# Patient Record
Sex: Female | Born: 1980 | Race: White | Hispanic: No | Smoking: Never smoker
Health system: Southern US, Community
[De-identification: ages and names within clinical notes are randomized; demographics above are authoritative.]

## PROBLEM LIST (undated history)

## (undated) DIAGNOSIS — E785 Hyperlipidemia, unspecified: Secondary | ICD-10-CM

## (undated) DIAGNOSIS — F209 Schizophrenia, unspecified: Secondary | ICD-10-CM

## (undated) DIAGNOSIS — E079 Disorder of thyroid, unspecified: Secondary | ICD-10-CM

## (undated) DIAGNOSIS — F32A Depression, unspecified: Secondary | ICD-10-CM

## (undated) DIAGNOSIS — F419 Anxiety disorder, unspecified: Secondary | ICD-10-CM

## (undated) DIAGNOSIS — D219 Benign neoplasm of connective and other soft tissue, unspecified: Secondary | ICD-10-CM

## (undated) DIAGNOSIS — K219 Gastro-esophageal reflux disease without esophagitis: Secondary | ICD-10-CM

## (undated) DIAGNOSIS — E119 Type 2 diabetes mellitus without complications: Secondary | ICD-10-CM

## (undated) DIAGNOSIS — I10 Essential (primary) hypertension: Secondary | ICD-10-CM

## (undated) HISTORY — PX: ABDOMINAL SURGERY: SHX537

## (undated) HISTORY — DX: Schizophrenia, unspecified: F20.9

## (undated) HISTORY — PX: LAPAROSCOPIC GASTRIC BANDING: SHX1100

## (undated) HISTORY — DX: Anxiety disorder, unspecified: F41.9

## (undated) HISTORY — DX: Hyperlipidemia, unspecified: E78.5

## (undated) HISTORY — DX: Disorder of thyroid, unspecified: E07.9

## (undated) HISTORY — DX: Benign neoplasm of connective and other soft tissue, unspecified: D21.9

## (undated) HISTORY — DX: Gastro-esophageal reflux disease without esophagitis: K21.9

## (undated) HISTORY — PX: LAPAROSCOPIC GASTRIC SLEEVE RESECTION: SHX5895

## (undated) HISTORY — DX: Depression, unspecified: F32.A

---

## 2018-07-14 DIAGNOSIS — K760 Fatty (change of) liver, not elsewhere classified: Secondary | ICD-10-CM | POA: Insufficient documentation

## 2018-07-14 HISTORY — DX: Fatty (change of) liver, not elsewhere classified: K76.0

## 2021-09-11 DIAGNOSIS — I10 Essential (primary) hypertension: Secondary | ICD-10-CM | POA: Insufficient documentation

## 2021-09-11 DIAGNOSIS — F319 Bipolar disorder, unspecified: Secondary | ICD-10-CM | POA: Insufficient documentation

## 2021-09-11 DIAGNOSIS — N941 Unspecified dyspareunia: Secondary | ICD-10-CM | POA: Insufficient documentation

## 2021-12-23 ENCOUNTER — Other Ambulatory Visit: Payer: Self-pay

## 2021-12-23 ENCOUNTER — Emergency Department: Payer: Medicare HMO

## 2021-12-23 ENCOUNTER — Emergency Department
Admission: EM | Admit: 2021-12-23 | Discharge: 2021-12-23 | Disposition: A | Discharge status: Home / Self Care | Payer: Medicare HMO | Attending: Emergency Medicine | Admitting: Emergency Medicine

## 2021-12-23 DIAGNOSIS — R0989 Other specified symptoms and signs involving the circulatory and respiratory systems: Secondary | ICD-10-CM | POA: Diagnosis not present

## 2021-12-23 DIAGNOSIS — R42 Dizziness and giddiness: Secondary | ICD-10-CM | POA: Diagnosis not present

## 2021-12-23 DIAGNOSIS — R1110 Vomiting, unspecified: Secondary | ICD-10-CM | POA: Insufficient documentation

## 2021-12-23 DIAGNOSIS — R059 Cough, unspecified: Secondary | ICD-10-CM | POA: Diagnosis present

## 2021-12-23 HISTORY — DX: Essential (primary) hypertension: I10

## 2021-12-23 HISTORY — DX: Type 2 diabetes mellitus without complications: E11.9

## 2021-12-23 MED ORDER — LIDOCAINE VISCOUS HCL 2 % MT SOLN: 15.0000 mL | OROMUCOSAL | 0 refills | Status: DC | PRN

## 2021-12-23 MED ORDER — LIDOCAINE VISCOUS HCL 2 % MT SOLN
15.0000 mL | Freq: Once | OROMUCOSAL | Status: AC
  Administered 2021-12-23: 15 mL via OROMUCOSAL
  Filled 2021-12-23: qty 15

## 2021-12-23 MED ORDER — DOXYCYCLINE HYCLATE 100 MG PO TABS: 100.0000 mg | ORAL_TABLET | Freq: Two times a day (BID) | ORAL | 0 refills | Status: DC

## 2021-12-23 NOTE — ED Provider Notes (Signed)
? ?[facility] ?Provider Note ? ?Patient Contact: 10:33 PM (approximate) ? ? ?History  ? ?Foreign Body ? ? ?HPI ? ?Jenna Kaufman is a 41 y.o. female who presents the emergency department complaining of scratchy throat, wheezing after choking on a pill.  Patient was taking a pill with a small sip of water when she felt like she got choked on the pill.  Patient states that this caused her to cough which eventually led to emesis.  Patient was concerned that she had aspirated she has had some wheezing since her emesis.  Patient states that the foreign body/globus sensation has improved at this time.  She has no difficulty swallowing or controlling secretions.  Patient is currently coughing in the emergency department. ?  ? ? ?Physical Exam  ? ?Triage Vital Signs: ?ED Triage Vitals  ?Enc Vitals Group  ?   BP 12/23/21 2018 134/86  ?   Pulse Rate 12/23/21 2018 92  ?   Resp 12/23/21 2018 18  ?   Temp 12/23/21 2018 98.7 ?F (37.1 ?C)  ?   Temp Source 12/23/21 2018 Oral  ?   SpO2 12/23/21 2018 93 %  ?   Weight 12/23/21 2020 255 lb (115.7 kg)  ?   Height 12/23/21 2020 '5\' 7"'$  (1.702 m)  ?   Head Circumference --   ?   Peak Flow --   ?   Pain Score 12/23/21 2020 4  ?   Pain Loc --   ?   Pain Edu? --   ?   Excl. in [place]? --   ? ? ?Most recent vital signs: ?Vitals:  ? 12/23/21 2018  ?BP: 134/86  ?Pulse: 92  ?Resp: 18  ?Temp: 98.7 ?F (37.1 ?C)  ?SpO2: 93%  ? ? ? ?General: Alert and in no acute distress. ?ENT: ?     Ears:  ?     Nose: No congestion/rhinnorhea. ?     Mouth/Throat: Mucous membranes are moist.  No visualized foreign body.  Uvula midline. ?Neck: No stridor. No cervical spine tenderness to palpation. ?Hematological/Lymphatic/Immunilogical: No cervical lymphadenopathy. ?Cardiovascular:  Good peripheral perfusion ?Respiratory: Normal respiratory effort without tachypnea or retractions. Lungs CTAB. Good air entry to the bases with no decreased or absent breath sounds. ?Gastrointestinal: Bowel sounds ?4  quadrants. Soft and nontender to palpation. No guarding or rigidity. No palpable masses. No distention. No CVA tenderness. ?Musculoskeletal: Full range of motion to all extremities.  ?Neurologic:  No gross focal neurologic deficits are appreciated.  ?Skin:   No rash noted ?Other: ? ? ?ED Results / Procedures / Treatments  ? ?Labs ?(all labs ordered are listed, but only abnormal results are displayed) ?Labs Reviewed - No data to display ? ? ?EKG ? ? ? ? ?RADIOLOGY ? ?I personally viewed and evaluated these images as part of my medical decision making, as well as reviewing the written report by the radiologist. ? ?ED Provider Interpretation: No evidence of radiopaque foreign body in the soft tissues of the neck or chest.  No other acute cardiopulmonary findings on chest x-ray. ? ?DG Neck Soft Tissue ? ?Result Date: 12/23/2021 ?CLINICAL DATA:  Shortness of breath, possible foreign body. Sensation of pill getting stuck. EXAM: NECK SOFT TISSUES - 1+ VIEW COMPARISON:  None. FINDINGS: There is no evidence of retropharyngeal soft tissue swelling or epiglottic enlargement. The cervical airway is unremarkable. No radio-opaque foreign body identified. Soft tissue planes are normal. IMPRESSION: Negative soft tissue neck radiographs. Electronically Signed   By:  Keith Rake M.D.   On: 12/23/2021 20:52  ? ?DG Chest 2 View ? ?Result Date: 12/23/2021 ?CLINICAL DATA:  Shortness of breath, possible foreign body. EXAM: CHEST - 2 VIEW COMPARISON:  None. FINDINGS: Lungs are symmetrically inflatedthe cardiomediastinal contours are normal. The lungs are clear. Pulmonary vasculature is normal. No consolidation, pleural effusion, or pneumothorax. No gaseous esophageal dilatation. No pneumomediastinum or subcutaneous emphysema. No acute osseous abnormalities are seen. IMPRESSION: Negative radiographs of the chest. Electronically Signed   By: Keith Rake M.D.   On: 12/23/2021 20:52   ? ?PROCEDURES: ? ?Critical Care performed:  No ? ?Procedures ? ? ?MEDICATIONS ORDERED IN ED: ?Medications  ?lidocaine (XYLOCAINE) 2 % viscous mouth solution 15 mL (15 mLs Mouth/Throat Given 12/23/21 2242)  ? ? ? ?IMPRESSION / MDM / ASSESSMENT AND PLAN / ED COURSE  ?I reviewed the triage vital signs and the nursing notes. ?             ?               ? ?Differential diagnosis includes, but is not limited to, globus sensation, inhaled foreign body, esophageal foreign body, aspiration ? ? ?Patient's diagnosis is consistent with globus sensation.  Patient presented to the emergency department after choking on a pill she was taken earlier.  Patient then started coughing, had an episode of emesis.  She was concerned that she may have aspirated.  Imaging revealed no evidence of aspiration and no evidence of retained radiopaque foreign body.  Patient globus sensation has resolved at this time.  She will be given viscous lidocaine.  Again low suspicion for aspiration given her age though I will prescribe antibiotics and advised her that if she is still symptomatic with cough and sensation of wheezing after 48 hours she may start the antibiotics at that time.  Patient is agreeable with this plan.  Return precautions discussed with the patient.  Follow-up primary care as needed..  Patient is given ED precautions to return to the ED for any worsening or new symptoms. ? ? ? ?  ? ? ?FINAL CLINICAL IMPRESSION(S) / ED DIAGNOSES  ? ?Final diagnoses:  ?Globus sensation  ? ? ? ?Rx / DC Orders  ? ?ED Discharge Orders   ? ?      Ordered  ?  lidocaine (XYLOCAINE) 2 % solution  Every 4 hours PRN       ? 12/23/21 2242  ?  doxycycline (VIBRA-TABS) 100 MG tablet  2 times daily       ? 12/23/21 2242  ? ?  ?  ? ?  ? ? ? ?Note:  This document was prepared using Dragon voice recognition software and may include unintentional dictation errors. ?  ?Darletta Moll, PA-C ?12/23/21 2242 ? ?  ?Rada Hay, MD ?12/23/21 2348 ? ?

## 2021-12-23 NOTE — Discharge Instructions (Addendum)
Wait for 48 hours, if you are still coughing, feel like you are wheezing or having pressure in your chest then start the antibiotic.  Otherwise use the liquid medication to improve symptoms. ?

## 2021-12-23 NOTE — ED Notes (Signed)
Pt signed esignature  d/c inst to pt.   

## 2021-12-23 NOTE — ED Notes (Signed)
Patient is able to ambulate and speak without difficulty. No dyspnea noted. NAD. ?

## 2021-12-23 NOTE — ED Triage Notes (Signed)
Pt presents to ER c/o pill being stuck in her throat x1 hr.  Pt states she has been trying to clear her throat and get the foreign body out without relief.  Pt states she has been coughing so much that she is now wheezing.  No audible wheezing noted in triage.  Pt is A&O x4 at this time in NAD.   ?

## 2022-02-05 ENCOUNTER — Encounter: Payer: Self-pay | Admitting: Obstetrics and Gynecology

## 2022-03-20 ENCOUNTER — Telehealth: Payer: Self-pay | Admitting: Obstetrics and Gynecology

## 2022-03-20 NOTE — Telephone Encounter (Signed)
Pt called asking for a sooner apt- pt is a new gyn to see cherry. Pt complains of breast hardening , lumps, pian tender to touch. Fever yesterday. Pt also states uterine fibroids that she is in need of surgery. Pt is currently on wait list. Please advise.

## 2022-03-21 NOTE — Telephone Encounter (Signed)
I guess if you can find two 15 min slots to put together that will get her in early in July we can try that.

## 2022-04-14 ENCOUNTER — Encounter: Payer: Medicaid Other | Admitting: Obstetrics and Gynecology

## 2022-04-23 ENCOUNTER — Encounter: Payer: Medicaid Other | Admitting: Obstetrics and Gynecology

## 2022-05-05 ENCOUNTER — Other Ambulatory Visit
Admission: RE | Admit: 2022-05-05 | Discharge: 2022-05-05 | Disposition: A | Discharge status: Home / Self Care | Payer: Medicare Other | Source: Ambulatory Visit | Attending: Infectious Diseases | Admitting: Infectious Diseases

## 2022-05-05 DIAGNOSIS — N941 Unspecified dyspareunia: Secondary | ICD-10-CM | POA: Diagnosis present

## 2022-05-05 DIAGNOSIS — E1165 Type 2 diabetes mellitus with hyperglycemia: Secondary | ICD-10-CM | POA: Diagnosis not present

## 2022-05-05 DIAGNOSIS — E782 Mixed hyperlipidemia: Secondary | ICD-10-CM | POA: Insufficient documentation

## 2022-05-05 DIAGNOSIS — I10 Essential (primary) hypertension: Secondary | ICD-10-CM | POA: Diagnosis present

## 2022-05-05 DIAGNOSIS — R0683 Snoring: Secondary | ICD-10-CM | POA: Insufficient documentation

## 2022-05-05 DIAGNOSIS — Z794 Long term (current) use of insulin: Secondary | ICD-10-CM | POA: Insufficient documentation

## 2022-05-05 DIAGNOSIS — D219 Benign neoplasm of connective and other soft tissue, unspecified: Secondary | ICD-10-CM | POA: Insufficient documentation

## 2022-05-05 DIAGNOSIS — E538 Deficiency of other specified B group vitamins: Secondary | ICD-10-CM | POA: Insufficient documentation

## 2022-05-05 DIAGNOSIS — E039 Hypothyroidism, unspecified: Secondary | ICD-10-CM | POA: Insufficient documentation

## 2022-05-05 LAB — LITHIUM LEVEL: Lithium Lvl: <0.06 mmol/L — ABNORMAL LOW (ref 0.60–1.20)

## 2022-05-12 ENCOUNTER — Ambulatory Visit (HOSPITAL_COMMUNITY)
Admission: EM | Admit: 2022-05-12 | Discharge: 2022-05-13 | Disposition: A | Discharge status: Other Acute Inpatient Hospital | Payer: Medicare Other | Attending: Psychiatry | Admitting: Psychiatry

## 2022-05-12 ENCOUNTER — Other Ambulatory Visit: Payer: Self-pay

## 2022-05-12 DIAGNOSIS — Z20822 Contact with and (suspected) exposure to covid-19: Secondary | ICD-10-CM | POA: Insufficient documentation

## 2022-05-12 DIAGNOSIS — Z5181 Encounter for therapeutic drug level monitoring: Secondary | ICD-10-CM | POA: Diagnosis not present

## 2022-05-12 DIAGNOSIS — E119 Type 2 diabetes mellitus without complications: Secondary | ICD-10-CM | POA: Insufficient documentation

## 2022-05-12 DIAGNOSIS — R451 Restlessness and agitation: Secondary | ICD-10-CM | POA: Diagnosis not present

## 2022-05-12 DIAGNOSIS — R454 Irritability and anger: Secondary | ICD-10-CM | POA: Insufficient documentation

## 2022-05-12 DIAGNOSIS — Z3202 Encounter for pregnancy test, result negative: Secondary | ICD-10-CM | POA: Insufficient documentation

## 2022-05-12 DIAGNOSIS — F250 Schizoaffective disorder, bipolar type: Secondary | ICD-10-CM | POA: Diagnosis not present

## 2022-05-12 DIAGNOSIS — F419 Anxiety disorder, unspecified: Secondary | ICD-10-CM | POA: Insufficient documentation

## 2022-05-12 DIAGNOSIS — Z79899 Other long term (current) drug therapy: Secondary | ICD-10-CM | POA: Insufficient documentation

## 2022-05-12 DIAGNOSIS — B009 Herpesviral infection, unspecified: Secondary | ICD-10-CM | POA: Diagnosis not present

## 2022-05-12 DIAGNOSIS — F259 Schizoaffective disorder, unspecified: Secondary | ICD-10-CM | POA: Diagnosis present

## 2022-05-12 LAB — CBC WITH DIFFERENTIAL/PLATELET
Abs Immature Granulocytes: 0.05 10*3/uL (ref 0.00–0.07)
Basophils Absolute: 0.1 10*3/uL (ref 0.0–0.1)
Basophils Relative: 1 %
Eosinophils Absolute: 0.3 10*3/uL (ref 0.0–0.5)
Eosinophils Relative: 2 %
HCT: 39.1 % (ref 36.0–46.0)
Hemoglobin: 13.1 g/dL (ref 12.0–15.0)
Immature Granulocytes: 0 %
Lymphocytes Relative: 31 %
Lymphs Abs: 4 10*3/uL (ref 0.7–4.0)
MCH: 29.8 pg (ref 26.0–34.0)
MCHC: 33.5 g/dL (ref 30.0–36.0)
MCV: 89.1 fL (ref 80.0–100.0)
Monocytes Absolute: 0.7 10*3/uL (ref 0.1–1.0)
Monocytes Relative: 6 %
Neutro Abs: 7.7 10*3/uL (ref 1.7–7.7)
Neutrophils Relative %: 60 %
Platelets: 341 10*3/uL (ref 150–400)
RBC: 4.39 MIL/uL (ref 3.87–5.11)
RDW: 14.6 % (ref 11.5–15.5)
WBC: 12.8 10*3/uL — ABNORMAL HIGH (ref 4.0–10.5)
nRBC: 0 % (ref 0.0–0.2)

## 2022-05-12 LAB — POC SARS CORONAVIRUS 2 AG -  ED: SARS Coronavirus 2 Ag: NEGATIVE

## 2022-05-12 LAB — COMPREHENSIVE METABOLIC PANEL
ALT: 60 U/L — ABNORMAL HIGH (ref 0–44)
AST: 47 U/L — ABNORMAL HIGH (ref 15–41)
Albumin: 3.9 g/dL (ref 3.5–5.0)
Alkaline Phosphatase: 80 U/L (ref 38–126)
Anion gap: 10 (ref 5–15)
BUN: 8 mg/dL (ref 6–20)
CO2: 25 mmol/L (ref 22–32)
Calcium: 9.9 mg/dL (ref 8.9–10.3)
Chloride: 103 mmol/L (ref 98–111)
Creatinine, Ser: 0.56 mg/dL (ref 0.44–1.00)
GFR, Estimated: >60 mL/min (ref >60)
Glucose, Bld: 174 mg/dL — ABNORMAL HIGH (ref 70–99)
Potassium: 4.1 mmol/L (ref 3.5–5.1)
Sodium: 138 mmol/L (ref 135–145)
Total Bilirubin: 0.5 mg/dL (ref 0.3–1.2)
Total Protein: 7.5 g/dL (ref 6.5–8.1)

## 2022-05-12 LAB — POCT URINE DRUG SCREEN - MANUAL ENTRY (I-SCREEN)
POC Amphetamine UR: NOT DETECTED
POC Buprenorphine (BUP): NOT DETECTED
POC Cocaine UR: NOT DETECTED
POC Marijuana UR: NOT DETECTED
POC Methadone UR: NOT DETECTED
POC Methamphetamine UR: NOT DETECTED
POC Morphine: NOT DETECTED
POC Oxazepam (BZO): NOT DETECTED
POC Oxycodone UR: NOT DETECTED
POC Secobarbital (BAR): NOT DETECTED

## 2022-05-12 LAB — MAGNESIUM: Magnesium: 1.9 mg/dL (ref 1.7–2.4)

## 2022-05-12 LAB — LIPID PANEL
Cholesterol: 270 mg/dL — ABNORMAL HIGH (ref 0–200)
HDL: 39 mg/dL — ABNORMAL LOW (ref >40)
LDL Cholesterol: UNDETERMINED mg/dL (ref 0–99)
Total CHOL/HDL Ratio: 6.9 RATIO
Triglycerides: 830 mg/dL — ABNORMAL HIGH (ref <150)
VLDL: UNDETERMINED mg/dL (ref 0–40)

## 2022-05-12 LAB — URINALYSIS, ROUTINE W REFLEX MICROSCOPIC
Bilirubin Urine: NEGATIVE
Glucose, UA: >=500 mg/dL — AB
Ketones, ur: NEGATIVE mg/dL
Leukocytes,Ua: NEGATIVE
Nitrite: NEGATIVE
Protein, ur: NEGATIVE mg/dL
Specific Gravity, Urine: 1.015 (ref 1.005–1.030)
pH: 6 (ref 5.0–8.0)

## 2022-05-12 LAB — GLUCOSE, CAPILLARY: Glucose-Capillary: 263 mg/dL — ABNORMAL HIGH (ref 70–99)

## 2022-05-12 LAB — POC SARS CORONAVIRUS 2 AG: SARSCOV2ONAVIRUS 2 AG: NEGATIVE

## 2022-05-12 LAB — URINALYSIS, MICROSCOPIC (REFLEX)

## 2022-05-12 LAB — LDL CHOLESTEROL, DIRECT: Direct LDL: 109 mg/dL — ABNORMAL HIGH (ref 0–99)

## 2022-05-12 LAB — RESP PANEL BY RT-PCR (FLU A&B, COVID) ARPGX2
Influenza A by PCR: NEGATIVE
Influenza B by PCR: NEGATIVE
SARS Coronavirus 2 by RT PCR: NEGATIVE

## 2022-05-12 LAB — TSH: TSH: 1.003 u[IU]/mL (ref 0.350–4.500)

## 2022-05-12 LAB — LITHIUM LEVEL: Lithium Lvl: 0.33 mmol/L — ABNORMAL LOW (ref 0.60–1.20)

## 2022-05-12 LAB — PREGNANCY, URINE: Preg Test, Ur: NEGATIVE

## 2022-05-12 LAB — HEMOGLOBIN A1C
Hgb A1c MFr Bld: 9.3 % — ABNORMAL HIGH (ref 4.8–5.6)
Mean Plasma Glucose: 220.21 mg/dL

## 2022-05-12 LAB — ETHANOL: Alcohol, Ethyl (B): <10 mg/dL (ref <10)

## 2022-05-12 LAB — POCT PREGNANCY, URINE: Preg Test, Ur: NEGATIVE

## 2022-05-12 MED ORDER — LEVOTHYROXINE SODIUM 88 MCG PO TABS
88.0000 ug | ORAL_TABLET | Freq: Every day | ORAL | Status: DC
  Administered 2022-05-13: 88 ug via ORAL
  Filled 2022-05-12: qty 1

## 2022-05-12 MED ORDER — METFORMIN HCL 500 MG PO TABS
1000.0000 mg | ORAL_TABLET | Freq: Two times a day (BID) | ORAL | Status: DC
  Administered 2022-05-12 – 2022-05-13 (×2): 1000 mg via ORAL
  Filled 2022-05-12 (×2): qty 2

## 2022-05-12 MED ORDER — RISPERIDONE 2 MG PO TBDP
2.0000 mg | ORAL_TABLET | Freq: Every day | ORAL | Status: DC
  Administered 2022-05-12: 2 mg via ORAL
  Filled 2022-05-12: qty 1

## 2022-05-12 MED ORDER — MAGNESIUM HYDROXIDE 400 MG/5ML PO SUSP: 30.0000 mL | Freq: Every day | ORAL | Status: DC | PRN

## 2022-05-12 MED ORDER — FENOFIBRATE 160 MG PO TABS
160.0000 mg | ORAL_TABLET | Freq: Every day | ORAL | Status: DC
  Administered 2022-05-13: 160 mg via ORAL
  Filled 2022-05-12: qty 1

## 2022-05-12 MED ORDER — HYDROXYZINE HCL 25 MG PO TABS: 25.0000 mg | ORAL_TABLET | Freq: Three times a day (TID) | ORAL | Status: DC | PRN

## 2022-05-12 MED ORDER — TRAZODONE HCL 50 MG PO TABS
50.0000 mg | ORAL_TABLET | Freq: Every evening | ORAL | Status: DC | PRN
  Administered 2022-05-13: 50 mg via ORAL
  Filled 2022-05-12: qty 1

## 2022-05-12 MED ORDER — RISPERIDONE 1 MG PO TBDP
1.0000 mg | ORAL_TABLET | Freq: Every day | ORAL | Status: DC
  Administered 2022-05-13: 1 mg via ORAL
  Filled 2022-05-12: qty 1

## 2022-05-12 MED ORDER — ACETAMINOPHEN 325 MG PO TABS
650.0000 mg | ORAL_TABLET | Freq: Four times a day (QID) | ORAL | Status: DC | PRN
  Administered 2022-05-13: 650 mg via ORAL
  Filled 2022-05-12: qty 2

## 2022-05-12 MED ORDER — VALACYCLOVIR HCL 500 MG PO TABS
1000.0000 mg | ORAL_TABLET | Freq: Once | ORAL | Status: AC
  Administered 2022-05-13: 1000 mg via ORAL
  Filled 2022-05-12: qty 2

## 2022-05-12 MED ORDER — LITHIUM CARBONATE 300 MG PO CAPS
300.0000 mg | ORAL_CAPSULE | Freq: Two times a day (BID) | ORAL | Status: DC
  Administered 2022-05-12 – 2022-05-13 (×2): 300 mg via ORAL
  Filled 2022-05-12 (×2): qty 1

## 2022-05-12 MED ORDER — ALUM & MAG HYDROXIDE-SIMETH 200-200-20 MG/5ML PO SUSP: 30.0000 mL | ORAL | Status: DC | PRN

## 2022-05-12 NOTE — Progress Notes (Signed)
Pt was accepted to [facility] 05/13/22 after 8:00am; Bed Assignment [place]  Pt meets inpatient criteria per Thomes Lolling, NP  Attending Physician will be Dr. Hermina Barters  Report can be called to: [phone]   Pt can arrive after 8:00am   Care Team notified: Thomes Lolling, NP, and Luanna Salk, RN  Nadara Mode, [place] 05/12/2022 @ 7:02 PM

## 2022-05-12 NOTE — ED Notes (Signed)
Pt was given dinner.

## 2022-05-12 NOTE — ED Notes (Signed)
Patient arrived to the unit and was oriented to unit. Given a sandwich and snack to eat. Denies current SI/HI, endorses auditory hallucinations and some command hallucinations. Denies pain at present.

## 2022-05-12 NOTE — ED Notes (Signed)
Pt resting in bed. Respirations even and unlabored. Monitoring for safety. 

## 2022-05-12 NOTE — Discharge Instructions (Addendum)
Transfer to [facility] for IP admission

## 2022-05-12 NOTE — ED Notes (Signed)
Pt sitting on bed, appropriately interacting with peers on unit. A&O x4, calm and cooperative. Denies current SI/HI/AVH and states, "I'll let you know if I start getting bad voices." Pt is observed talking to self at times. No signs of distress noted. Monitoring for safety.

## 2022-05-12 NOTE — BH Assessment (Addendum)
Comprehensive Clinical Assessment (CCA) Note  05/12/2022 Laurita Peron [id number]  Disposition: Per Thomes Lolling, NP inpatient treatment is recommended.  [place] [facility] [place] to review.  Disposition SW to pursue appropriate inpatient options.  The patient demonstrates the following risk factors for suicide: Chronic risk factors for suicide include: psychiatric disorder of Schizoaffective Disorder, bipolar type . Acute risk factors for suicide include: family or marital conflict, social withdrawal/isolation, and loss (financial, interpersonal, professional). Protective factors for this patient include: positive social support, responsibility to others (children, family), coping skills, and hope for the future. Considering these factors, the overall suicide risk at this point appears to be low. Patient is appropriate for outpatient follow up once stabilized.   Patient is a 41 year old female with a history of Schizoaffective Disorder, bipolar type who presents with her mother voluntarily to [facility] for assessment.  Patient recently moved to [facility] from [facility] in Dec 2022.  She has been diagnosed with Schizoaffective Disorder, bipolar type and she is compliant with medications Rx by her [facility] outpt provider.  She has scheduled an intake appt with Dr. Darleene Cleaver for 8/16 to continue medication management here.  Patient reports she moved here to live with parents after her divorce was finalized in Dec.  Patient states her medications may need to be adjusted, as she is expereincing worsening intrusive AH, with command and "possessive type" hallucinations.  She reports the voices are constant and she is sruggling to cope, especially as she has difficulty focusing and functioning.  She reports the voices are worse at night, however she has been experiencing them at work from time to time.  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  She typically hears the voices more at night, however recently  she has been hearing them from time to time at work.  She gives the example of the voice occasionally telling her to "take your shears and stab the client in the neck."  Patient denies any intent to act on any of these command hallucinations and states she continues to try to distract herself from the voices.  She is more frustrated with the content, negativity, commands and "possessiveness" of the voices.  She states they also have been telling her she is going to get kidnapped, raped, her car stolen or killed.  Patient denies SI, HI or SA hx.  Patient states she is hoping to have a psychiatrist adjust her medications, and she is open to inpatient admission if recommended.     Chief Complaint: No chief complaint on file.  Visit Diagnosis: Schizoaffective Disorder, bipolar type   Flowsheet Row ED from 05/12/2022 in [facility]  Thoughts that you would be better off dead, or of hurting yourself in some way Not at all  PHQ-9 Total Score 10      [place] ED from 12/23/2021 in [place] No Risk        CCA Screening, Triage and Referral (STR)  Patient Reported Information How did you hear about [place]? Family/Friend  What Is the Reason for Your Visit/Call Today? Patient presents with her mother for assessment.  [place] [facility] [place]  She has been diagnosed with Schizoaffective Disorder, bipolar type and she compliant with medications Rx by her [facility] outpt provider.  She has scheduled an intake appt with Dr. Darleene Cleaver for 8/16.  Patient reports she moved here to live with parents after  her divorce was finalized in Dec.  Patient states her medications may need to be adjusted, as she is expereincing worsening intrusive AH, with command and "possessive type" hallucinations.  She reports the voices are constant and she is sruggling to cope, especially as she has difficulty focusing and  functioning.  Patient denies SI, HI or SA hx.  She denies HI, however does share that the voices have been telling her to harm others.  How Long Has This Been Causing You Problems? 1-6 months  What Do You Feel Would Help You the Most Today? Treatment for Depression or other mood problem   Have You Recently Had Any Thoughts About Hurting Yourself? No  Are You Planning to Commit Suicide/Harm Yourself At This time? No   Have you Recently Had Thoughts About [place]? No  Are You Planning to Harm Someone at This Time? No  Explanation: No data recorded  Have You Used Any Alcohol or Drugs in the Past 24 Hours? No  How Long Ago Did You Use Drugs or Alcohol? No data recorded What Did You Use and How Much? No data recorded  Do You Currently Have a Therapist/Psychiatrist? No data recorded Name of Therapist/Psychiatrist: No data recorded  Have You Been Recently Discharged From Any Office Practice or Programs? No data recorded Explanation of Discharge From Practice/Program: No data recorded    CCA Screening Triage Referral Assessment Type of Contact: No data recorded Telemedicine Service Delivery:   Is this Initial or Reassessment? No data recorded Date Telepsych consult ordered in [facility]:  No data recorded Time Telepsych consult ordered in [facility]:  No data recorded Location of Assessment: No data recorded Provider Location: No data recorded  Collateral Involvement: No data recorded  Does Patient Have a [place]? No data recorded Name and Contact of Legal Guardian: No data recorded If Minor and Not Living with Parent(s), Who has Custody? No data recorded Is CPS involved or ever been involved? No data recorded Is APS involved or ever been involved? No data recorded  Patient Determined To Be At Risk for Harm To Self or Others Based on Review of Patient Reported Information or Presenting Complaint? No data recorded Method: No data recorded Availability of  Means: No data recorded Intent: No data recorded Notification Required: No data recorded Additional Information for Danger to Others Potential: No data recorded Additional Comments for Danger to Others Potential: No data recorded Are There Guns or Other Weapons in Your Home? No data recorded Types of Guns/Weapons: No data recorded Are These Weapons Safely Secured?                            No data recorded Who Could Verify You Are Able To Have These Secured: No data recorded Do You Have any Outstanding Charges, Pending Court Dates, Parole/Probation? No data recorded Contacted To Inform of Risk of Harm To Self or Others: No data recorded   Does Patient Present under Involuntary Commitment? No data recorded IVC Papers Initial File Date: No data recorded  [place] of Residence: No data recorded  Patient Currently Receiving the Following Services: No data recorded  Determination of Need: Urgent (48 hours)   Options For Referral: Inpatient Hospitalization     CCA Biopsychosocial Patient Reported Schizophrenia/Schizoaffective Diagnosis in Past: Yes   Strengths: Seeking treatment, advocates for herself, is treatment compliant   Mental Health Symptoms Depression:   Difficulty Concentrating   Duration of Depressive symptoms:  Duration of Depressive Symptoms: Less than two weeks   Mania:   Racing thoughts; Increased Energy   Anxiety:    Difficulty concentrating; Tension; Worrying; Restlessness; Irritability   Psychosis:   Hallucinations   Duration of Psychotic symptoms:  Duration of Psychotic Symptoms: Less than six months   Trauma:   None   Obsessions:   None   Compulsions:   None   Inattention:   N/A   Hyperactivity/Impulsivity:   N/A   Oppositional/Defiant Behaviors:   N/A   Emotional Irregularity:  No data recorded  Other Mood/Personality Symptoms:   Psychosis    Mental Status Exam Appearance and self-care  Stature:   Average   Weight:    Overweight   Clothing:   Casual   Grooming:   Normal   Cosmetic use:   Age appropriate   Posture/gait:   Normal   Motor activity:   Restless   Sensorium  Attention:   Normal; Persistent   Concentration:   Normal   Orientation:   X5   Recall/memory:   Normal   Affect and Mood  Affect:   Anxious; Labile   Mood:   Anxious; Irritable   Relating  Eye contact:   Fleeting   Facial expression:   Anxious; Constricted   Attitude toward examiner:   Cooperative   Thought and Language  Speech flow:  Clear and Coherent   Thought content:   Appropriate to Mood and Circumstances   Preoccupation:   None   Hallucinations:   Auditory; Command (Comment) (telling her to harm people, stab people)   Organization:  No data recorded  [employer] of Knowledge:   Average   Intelligence:   Average   Abstraction:   Functional   Judgement:   Fair   Reality Testing:   Adequate   Insight:   Gaps   Decision Making:   Vacilates   Social Functioning  Social Maturity:   Responsible   Social Judgement:   Normal   Stress  Stressors:   Relationship; Transitions   Coping Ability:   Overwhelmed   Skill Deficits:   Communication; Interpersonal; Self-care; Decision making   Supports:   Family     Religion: Religion/Spirituality Are You A Religious Person?: No  Leisure/Recreation: Leisure / Recreation Do You Have Hobbies?: No  Exercise/Diet: Exercise/Diet Do You Exercise?: No Have You Gained or Lost A Significant Amount of Weight in the Past Six Months?: No Do You Follow a Special Diet?: No Do You Have Any Trouble Sleeping?: Yes Explanation of Sleeping Difficulties: difficulty getting to sleep as voices are more intense at night   CCA Employment/Education Employment/Work Situation: Employment / Work Situation Employment Situation: Employed Work Stressors: None reported Patient's Job has Been Impacted by Current  Illness: Yes Describe how Patient's Job has Been Impacted: AH on occasion at work.  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 [place] Has Patient ever Been in the [employer]?: No  Education: Education Is Patient Currently Attending School?: No Last Grade Completed: 12 Did You Attend College?: Yes What Type of College Degree Do you Have?: cosmetology school Did You Have An Individualized Education Program (IIEP): No Did You Have Any Difficulty At School?: No Patient's Education Has Been Impacted by Current Illness: No   CCA Family/Childhood History Family and Relationship History: Family history Marital status: Divorced Divorced, when?: Dec 2022 What types of issues is patient dealing with in the relationship?: N/A Additional relationship information: N/A Does patient have children?: Yes How many  children?: 1 How is patient's relationship with their children?: No concerns noted  Childhood History:  Childhood History By whom was/is the patient raised?: Both parents Did patient suffer any verbal/emotional/physical/sexual abuse as a child?: No Did patient suffer from severe childhood neglect?: No Has patient ever been sexually abused/assaulted/raped as an adolescent or adult?: No Was the patient ever a victim of a crime or a disaster?: No Witnessed domestic violence?: No Has patient been affected by domestic violence as an adult?: No  Child/Adolescent Assessment:     CCA Substance Use Alcohol/Drug Use: Alcohol / Drug Use Pain Medications: See MAR Prescriptions: See MAR Over the Counter: See MAR History of alcohol / drug use?: No history of alcohol / drug abuse                         ASAM's:  Six Dimensions of Multidimensional Assessment  Dimension 1:  Acute Intoxication and/or Withdrawal Potential:      Dimension 2:  Biomedical Conditions and Complications:      Dimension 3:  Emotional, Behavioral, or Cognitive Conditions and Complications:     Dimension  4:  Readiness to Change:     Dimension 5:  Relapse, Continued use, or Continued Problem Potential:     Dimension 6:  Recovery/Living Environment:     ASAM Severity Score:    ASAM Recommended Level of Treatment:     Substance use Disorder (SUD)    Recommendations for Services/Supports/Treatments:    Discharge Disposition:    DSM5 Diagnoses: There are no problems to display for this patient.    Referrals to Alternative Service(s): Referred to Alternative Service(s):   Place:   Date:   Time:    Referred to Alternative Service(s):   Place:   Date:   Time:    Referred to Alternative Service(s):   Place:   Date:   Time:    Referred to Alternative Service(s):   Place:   Date:   Time:     Fransico Meadow, [facility]

## 2022-05-12 NOTE — Progress Notes (Signed)
Inpatient Behavioral Health Placement  Pt meets inpatient criteria per Thomes Lolling, NP. There are no available beds at [facility] per [place], RN. Referral was sent to the following facilities;    Destination Service Provider Address Phone Fax  [facility]  [address] [place] [address] [phone]  [facility]  [address] [place] [address] [phone] [place]  [place], [place] [address] [phone]  [facility]  [address] [place] [address] [phone] [facility]  [address] [place] [address] [phone]  [facility]  [address], [place] [address] [phone] [place]  [address] [place] [address] [phone] [place]  [address], [place] [address] [phone]  [facility]  [address] [place], [place] [address] [place]  [facility]  [address], [place] [address] [phone]   Situation ongoing,  CSW will follow up.   Benjaman Kindler, MSW, [facility] 05/12/2022  @ 6:07 PM

## 2022-05-12 NOTE — Progress Notes (Signed)
   05/12/22 1435  [place] (Walk-ins at [facility] only)  How Did You Hear About [place]? Family/Friend  What Is the Reason for Your Visit/Call Today? Patient presents with her mother for assessment.  She recently moved to [facility] [place].  She has been diagnosed with Schizoaffective Disorder, bipolar type and she compliant with medications Rx by her [place] outpt provider.  She has scheduled an intake appt with Dr. Darleene Cleaver for 8/16.  Patient reports she moved here to live with parents after her divorce was finalized in Dec.  Patient states her medications may need to be adjusted, as she is expereincing worsening intrusive AH, with command and "possessive type" hallucinations.  She reports the voices are constant and she is sruggling to cope, especially as she has difficulty focusing and functioning.  Patient denies SI, HI or SA hx.  She denies HI, however does share that the voices have been telling her to harm others.  How Long Has This Been Causing You Problems? 1-6 months  Have You Recently Had Any Thoughts About Hurting Yourself? No  Are You Planning to Commit Suicide/Harm Yourself At This time? No  Have you Recently Had Thoughts About [place]? No  Are You Planning To Harm Someone At This Time? No  Are you currently experiencing any auditory, visual or other hallucinations? Yes  Please explain the hallucinations you are currently experiencing: AH that are command type and distressing  Have You Used Any Alcohol or Drugs in the Past 24 Hours? No  Do you have any current medical co-morbidities that require immediate attention? No  Clinician description of patient physical appearance/behavior: Patient presents with elevated mood, frustration with worsening AH.  She is AAOx5 and cooperative.  What Do You Feel Would Help You the Most Today? Treatment for Depression or other mood problem  If access to [facility] was not available, would you have sought care in the Emergency  Department? Yes  Determination of Need Urgent (48 hours)  Options For Referral Inpatient Hospitalization

## 2022-05-12 NOTE — ED Provider Notes (Signed)
[facility] Continuous Assessment Admission H&P  Date: 05/12/22 Patient Name: Jenna Kaufman MRN: [id number] Chief Complaint: No chief complaint on file.     Diagnoses:  Final diagnoses:  Schizoaffective disorder, bipolar type ([place])    HPI: Jenna Kaufman 41 y.o., female patient presented to [facility] as a walk in voluntarily accompanied by her mother with complaints of increased auditory hallucinations.  Jenna Kaufman, 41 y.o., female patient seen face to face by this provider, consulted with Dr. Dwyane Dee; and chart reviewed on 05/12/22.  Chart review is limited.  Patient self reports a psychiatric history of schizoaffective bipolar type.  She has a medical history of diabetes and herpes.  She has been psychiatrically hospitalized in the past.  She is prescribed olanzapine 15 mg nightly and lithium 300 mg twice daily.  Lithium level upon assessment is 0.33.  She has a new outpatient appointment with Dr. Darleene Cleaver on 8/1 6.  On evaluation Jenna Kaufman reports she recently moved to [place] from [place] approximately 8 months ago after she divorced from her husband.  She is currently living with her mother.  Reports over the past few weeks she has begun to experience intrusive worsening command auditory hallucinations.  She hears different voices throughout the day and at times they worsen at night.  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  She hears voices that tell her to stab her clients in the neck with her shears.  Sometimes the voices will give her instructions to stand on the street for an hour and she will do it.  The voices ridicule and talked down to her.  They tell her that she is going to be raped, kidnapped, and killed.  She denies acting on any command or thought to hurt herself or anyone else.  Her voices have become so intrusive that it is interfering with her daily activities, ability to focus, and functioning with her job.  She presents today requesting medications  that can control her voices.  She is willing to be psychiatrically hospitalized if that is the recommendation.  During evaluation Jenna Kaufman is alert/oriented x4 and cooperative.  She is pleasant.  She is fairly groomed and makes fair eye contact.  Her speech is at a normal rate and tone.  She appears anxious and guarded.  She endorses an increase in her depressive symptoms which include difficulty concentrating, irritability, and restlessness.  She endorses racing thoughts but does not identify any specific thought.  She appears slightly paranoid.  She is observed talking quietly and looking down at the floor.  She appears to be responding to internal/external stimuli.  Discussed inpatient psychiatric admission with patient, she is in agreement.  She does not believe that her olanzapine is working and she would like for it to be discontinued and is requesting to be started on Risperdal.  States she has heard Risperdal works well for hallucinations.  PHQ 2-9:   [place] ED from 12/23/2021 in [place] No Risk        Total Time spent with patient: 30 minutes  Musculoskeletal  Strength & Muscle Tone: within normal limits Gait & Station: normal Patient leans: N/A  Psychiatric Specialty Exam  Presentation General Appearance: Casual  Eye Contact:Good  Speech:Clear and Coherent; Normal Rate  Speech Volume:No data recorded Handedness:No data recorded  Mood and Affect  Mood:No data recorded Affect:No data recorded  Thought Process  Thought Processes:No data recorded Descriptions of Associations:No data  recorded Orientation:No data recorded Thought Content:No data recorded   Hallucinations:No data recorded Ideas of Reference:No data recorded Suicidal Thoughts:No data recorded Homicidal Thoughts:No data recorded  Sensorium  Memory:No data recorded Judgment:No data recorded Insight:No data  recorded  Executive Functions  Concentration:No data recorded Attention Span:No data recorded Recall:No data recorded [employer] recorded Language:No data recorded  Psychomotor Activity  Psychomotor Activity:No data recorded  Assets  Assets:No data recorded  Sleep  Sleep:No data recorded  No data recorded  Physical Exam Vitals and nursing note reviewed.  Constitutional:      General: She is not in acute distress.    Appearance: Normal appearance. She is not ill-appearing.  HENT:     Head: Normocephalic.  Eyes:     General:        Right eye: No discharge.        Left eye: No discharge.     Conjunctiva/sclera: Conjunctivae normal.  Cardiovascular:     Rate and Rhythm: Normal rate.  Pulmonary:     Effort: Pulmonary effort is normal.  Musculoskeletal:        General: Normal range of motion.     Cervical back: Normal range of motion.  Skin:    Coloration: Skin is not jaundiced or pale.  Neurological:     Mental Status: She is alert and oriented to person, place, and time.  Psychiatric:        Attention and Perception: She perceives auditory hallucinations.        Mood and Affect: Affect normal. Mood is anxious.        Speech: Speech normal.        Behavior: Behavior is cooperative.        Thought Content: Thought content is paranoid.        Cognition and Memory: Cognition normal.   Review of Systems  Constitutional: Negative.   HENT: Negative.    Eyes: Negative.   Respiratory: Negative.    Cardiovascular: Negative.   Musculoskeletal: Negative.   Skin: Negative.   Neurological: Negative.   Psychiatric/Behavioral:  Positive for hallucinations. The patient is nervous/anxious.     Blood pressure (!) 146/79, pulse 91, temperature 98.7 F (37.1 C), temperature source Oral, resp. rate 18, SpO2 98 %. There is no height or weight on file to calculate BMI.  Past Psychiatric History: Schizoaffective disorder bipolar type  Is the patient at risk to  self? No  Has the patient been a risk to self in the past 6 months? No .    Has the patient been a risk to self within the distant past? No   Is the patient a risk to others? No   Has the patient been a risk to others in the past 6 months? No   Has the patient been a risk to others within the distant past? No   Past Medical History:  Past Medical History:  Diagnosis Date   Diabetes mellitus without complication ([place])    Hypertension     Past Surgical History:  Procedure Laterality Date   ABDOMINAL SURGERY      Family History: No family history on file.  Social History:  Social History   Socioeconomic History   Marital status: Divorced    Spouse name: Not on file   Number of children: Not on file   Years of education: Not on file   Highest education level: Not on file  Occupational History   Not on file  Tobacco Use   Smoking  status: Never   Smokeless tobacco: Never  Substance and Sexual Activity   Alcohol use: Never   Drug use: Never   Sexual activity: Not on file  Other Topics Concern   Not on file  Social History Narrative   Not on file   Social Determinants of Health   Financial Resource Strain: Not on file  Food Insecurity: Not on file  Transportation Needs: Not on file  Physical Activity: Not on file  Stress: Not on file  Social Connections: Not on file  Intimate Partner Violence: Not on file    SDOH:  SDOH Screenings   Alcohol Screen: Not on file  Depression (EGB1-5): Not on file  Financial Resource Strain: Not on file  Food Insecurity: Not on file  Housing: Not on file  Physical Activity: Not on file  Social Connections: Not on file  Stress: Not on file  Tobacco Use: Low Risk  (12/23/2021)   Patient History    Smoking Tobacco Use: Never    Smokeless Tobacco Use: Never    Passive Exposure: Not on file  Transportation Needs: Not on file    Last Labs:  Hospital Outpatient Visit on 05/05/2022  Component Date Value Ref Range Status    Lithium Lvl 05/05/2022 <0.06 (L)  0.60 - 1.20 mmol/L Final   Performed at [facility], [address] [place], [place] [address]    Allergies: Penicillins  PTA Medications: (Not in a hospital admission)   Medical Decision Making  Patient meets criteria for inpatient psychiatric admission.  She will be admitted to the continuous assessment unit while awaiting inpatient bed availability.    Recommendations  Based on my evaluation the patient does not appear to have an emergency medical condition.  Patient meets criteria for inpatient psychiatric admission.  She will be admitted to the continuous assessment unit while awaiting inpatient bed availability.  [facility] notified there is no bed availability.  Social work notified and patient has been faxed out.  We will discontinue olanzapine at patient's request.  Risperdal 2 mg nightly and 1 mg every morning initiated.   Meds ordered this encounter  Medications   magnesium hydroxide (MILK OF MAGNESIA) suspension 30 mL   acetaminophen (TYLENOL) tablet 650 mg   alum & mag hydroxide-simeth (MAALOX/MYLANTA) 200-200-20 MG/5ML suspension 30 mL   hydrOXYzine (ATARAX) tablet 25 mg TID PRN   traZODone (DESYREL) tablet 50 mg PRN QHS   valACYclovir (VALTREX) tablet 1,000 mgQD   metFORMIN (GLUCOPHAGE) tablet 1,000 mg BID   fenofibrate tablet 160 mg QD   levothyroxine (SYNTHROID) tablet 88 mcg QD   lithium carbonate capsule 300 mg BID   risperiDONE (RISPERDAL M-TABS) disintegrating tablet 2 mg QHS   risperiDONE (RISPERDAL M-TABS) disintegrating tablet 1 mg QAM    Lab Orders         Resp Panel by RT-PCR (Flu A&B, Covid) Anterior Nasal Swab         CBC with Differential/Platelet         Comprehensive metabolic panel         Hemoglobin A1c         Magnesium         Ethanol         Lipid panel         TSH         RPR         Pregnancy, urine         Lithium level  Urinalysis, Routine w reflex microscopic Urine, Clean  Catch         POCT Urine Drug Screen - (I-Screen)   (not collected at this time)      POC SARS Coronavirus 2 Ag-ED - Nasal Swab      EKG (has not been completed at this time)   Revonda Humphrey, NP 05/12/22  3:38 PM

## 2022-05-13 DIAGNOSIS — F250 Schizoaffective disorder, bipolar type: Secondary | ICD-10-CM | POA: Diagnosis not present

## 2022-05-13 LAB — GLUCOSE, CAPILLARY: Glucose-Capillary: 204 mg/dL — ABNORMAL HIGH (ref 70–99)

## 2022-05-13 LAB — RPR: RPR Ser Ql: NONREACTIVE

## 2022-05-13 MED ORDER — VALACYCLOVIR HCL 1 G PO TABS: 1000.0000 mg | ORAL_TABLET | Freq: Once | ORAL | 0 refills | Status: AC

## 2022-05-13 MED ORDER — LITHIUM CARBONATE 300 MG PO CAPS: 300.0000 mg | ORAL_CAPSULE | Freq: Two times a day (BID) | ORAL | Status: DC

## 2022-05-13 MED ORDER — METFORMIN HCL 1000 MG PO TABS
1000.0000 mg | ORAL_TABLET | Freq: Two times a day (BID) | ORAL | Status: DC
Start: 2022-05-13 — End: 2022-06-01

## 2022-05-13 MED ORDER — FENOFIBRATE 160 MG PO TABS: 160.0000 mg | ORAL_TABLET | Freq: Every day | ORAL | Status: AC

## 2022-05-13 MED ORDER — LEVOTHYROXINE SODIUM 88 MCG PO TABS: 88.0000 ug | ORAL_TABLET | Freq: Every day | ORAL | Status: DC

## 2022-05-13 MED ORDER — RISPERIDONE 2 MG PO TBDP
2.0000 mg | ORAL_TABLET | Freq: Every day | ORAL | Status: DC
Start: 2022-05-13 — End: 2022-06-01

## 2022-05-13 MED ORDER — RISPERIDONE 1 MG PO TBDP: 1.0000 mg | ORAL_TABLET | Freq: Every day | ORAL | Status: DC

## 2022-05-13 MED ORDER — TRAZODONE HCL 50 MG PO TABS: 50.0000 mg | ORAL_TABLET | Freq: Every evening | ORAL | Status: AC | PRN

## 2022-05-13 NOTE — ED Notes (Signed)
Safe Transport called for services to be provided to [facility]. All scheduled morning meds admin per request from [facility] receiving nurse. Pt tolerated well.

## 2022-05-13 NOTE — ED Notes (Signed)
Attempted to call report. Left message for nurse to return call for report.

## 2022-05-13 NOTE — ED Notes (Signed)
Pt resting in bed. Respirations even and unlabored. Monitoring for safety.

## 2022-05-13 NOTE — ED Notes (Addendum)
Report given to [place] at [facility]. She requested that pt receive all of her prescribed morning meds prior to departure from the facility.

## 2022-05-13 NOTE — ED Notes (Signed)
Discharge instructions provided and Pt stated understanding. Pt alert, orient and ambulatory prior to d/c from facility. Personal belongings returned from locker number  65. Safe transport called for transportation services. Pt escorted to the sally port. Report previously called to [facility] to [facility]. Copy of H&P and eMAR sent along with EMTALA. Safety maintained.

## 2022-05-13 NOTE — ED Provider Notes (Signed)
FBC/OBS ASAP Discharge Summary  Date and Time: 05/13/2022 7:53 AM  Name: Jenna Kaufman  MRN:  [id number]   Discharge Diagnoses:  Final diagnoses:  Schizoaffective disorder, bipolar type ([place])    Subjective: Jenna Kaufman 41 y.o., female patient presented to [facility] as a walk in voluntarily accompanied by her mother with complaints of increased auditory hallucinations.  Jenna Kaufman, 41 y.o., female patient seen face to face by this provider, consulted with Dr. Dwyane Dee ; and chart reviewed on 05/13/22.    On today's reevaluation Jenna Kaufman is observed laying in her bed asleep.  She is easily awakened.  She is alert/oriented x 4, calm, and cooperative.  She has normal speech and behavior.  She continues to endorse depression and anxiety.  She denies SI.  She denies that she endorses HI.  However she continues to have thoughts of stabbing her clients in the neck with her scissors.  She continues to endorse auditory hallucinations and racing thoughts.  Her voices continue to be command in nature.  She is hyperfocused on her medications.  Per staff patient exhibited some bizarre behavior while on the unit.  She has not appeared to be responding to internal/external stimuli at this time.  However she is easily distracted and has difficulty focusing.  Stay summary  Patient remained calm cooperative while on the unit.  She was compliant with medications.  She received no as needed medications for agitation.  She continues to meet criteria for inpatient psychiatric mission and has been accepted at [facility].  Total Time spent with patient: 30 minutes  Past Psychiatric History: See H&P Past Medical History:  Past Medical History:  Diagnosis Date   Diabetes mellitus without complication ([place])    Hypertension     Past Surgical History:  Procedure Laterality Date   ABDOMINAL SURGERY     Family History: No family history on file. Family Psychiatric History: See H&P Social  History:  Social History   Substance and Sexual Activity  Alcohol Use Never     Social History   Substance and Sexual Activity  Drug Use Never    Social History   Socioeconomic History   Marital status: Divorced    Spouse name: Not on file   Number of children: Not on file   Years of education: Not on file   Highest education level: Not on file  Occupational History   Not on file  Tobacco Use   Smoking status: Never   Smokeless tobacco: Never  Substance and Sexual Activity   Alcohol use: Never   Drug use: Never   Sexual activity: Not on file  Other Topics Concern   Not on file  Social History Narrative   Not on file   Social Determinants of Health   Financial Resource Strain: Not on file  Food Insecurity: Not on file  Transportation Needs: Not on file  Physical Activity: Not on file  Stress: Not on file  Social Connections: Not on file   SDOH:  SDOH Screenings   Alcohol Screen: Not on file  Depression (PHQ2-9): Medium Risk (05/12/2022)   Depression (PHQ2-9)    PHQ-2 Score: 10  Financial Resource Strain: Not on file  Food Insecurity: Not on file  Housing: Not on file  Physical Activity: Not on file  Social Connections: Not on file  Stress: Not on file  Tobacco Use: Low Risk  (12/23/2021)   Patient History    Smoking Tobacco Use: Never    Smokeless Tobacco Use: Never  Passive Exposure: Not on file  Transportation Needs: Not on file    Tobacco Cessation:  N/A, patient does not currently use tobacco products  Current Medications:  Current Facility-Administered Medications  Medication Dose Route Frequency Provider Last Rate Last Admin   acetaminophen (TYLENOL) tablet 650 mg  650 mg Oral Q6H PRN Revonda Humphrey, NP   650 mg at 05/13/22 0323   alum & mag hydroxide-simeth (MAALOX/MYLANTA) 200-200-20 MG/5ML suspension 30 mL  30 mL Oral Q4H PRN Revonda Humphrey, NP       fenofibrate tablet 160 mg  160 mg Oral Daily Revonda Humphrey, NP        hydrOXYzine (ATARAX) tablet 25 mg  25 mg Oral TID PRN Revonda Humphrey, NP       levothyroxine (SYNTHROID) tablet 88 mcg  88 mcg Oral Q0600 Revonda Humphrey, NP   88 mcg at 05/13/22 0547   lithium carbonate capsule 300 mg  300 mg Oral BID Revonda Humphrey, NP   300 mg at 05/12/22 2117   magnesium hydroxide (MILK OF MAGNESIA) suspension 30 mL  30 mL Oral Daily PRN Revonda Humphrey, NP       metFORMIN (GLUCOPHAGE) tablet 1,000 mg  1,000 mg Oral BID WC Revonda Humphrey, NP   1,000 mg at 05/12/22 1929   risperiDONE (RISPERDAL M-TABS) disintegrating tablet 1 mg  1 mg Oral Daily Revonda Humphrey, NP       risperiDONE (RISPERDAL M-TABS) disintegrating tablet 2 mg  2 mg Oral QHS Revonda Humphrey, NP   2 mg at 05/12/22 2116   traZODone (DESYREL) tablet 50 mg  50 mg Oral QHS PRN Revonda Humphrey, NP   50 mg at 05/13/22 0201   valACYclovir (VALTREX) tablet 1,000 mg  1,000 mg Oral Once Revonda Humphrey, NP       Current Outpatient Medications  Medication Sig Dispense Refill   fenofibrate 160 MG tablet Take 1 tablet (160 mg total) by mouth daily.     [START ON 05/14/2022] levothyroxine (SYNTHROID) 88 MCG tablet Take 1 tablet (88 mcg total) by mouth daily at 6 (six) AM.     lithium carbonate 300 MG capsule Take 1 capsule (300 mg total) by mouth 2 (two) times daily.     metFORMIN (GLUCOPHAGE) 1000 MG tablet Take 1 tablet (1,000 mg total) by mouth 2 (two) times daily with a meal.     risperiDONE (RISPERDAL M-TABS) 1 MG disintegrating tablet Take 1 tablet (1 mg total) by mouth daily.     risperiDONE (RISPERDAL M-TABS) 2 MG disintegrating tablet Take 1 tablet (2 mg total) by mouth at bedtime.     traZODone (DESYREL) 50 MG tablet Take 1 tablet (50 mg total) by mouth at bedtime as needed for sleep.     valACYclovir (VALTREX) 1000 MG tablet Take 1 tablet (1,000 mg total) by mouth once for 1 dose. 1 tablet 0    PTA Medications: (Not in a hospital admission)      05/12/2022    4:44 PM  Depression  screen PHQ 2/9  Decreased Interest 1  Down, Depressed, Hopeless 2  PHQ - 2 Score 3  Altered sleeping 2  Tired, decreased energy 1  Change in appetite 0  Feeling bad or failure about yourself  2  Trouble concentrating 2  Moving slowly or fidgety/restless 0  Suicidal thoughts 0  PHQ-9 Score 10  Difficult doing work/chores Somewhat difficult    Flowsheet Row ED from [facility]23/2021 in [facility]  [facility]  C-SSRS RISK CATEGORY No Risk       Musculoskeletal  Strength & Muscle Tone: within normal limits Gait & Station: normal Patient leans: N/A  Psychiatric Specialty Exam  Presentation  General Appearance: Casual  Eye Contact:Good  Speech:Clear and Coherent; Normal Rate  Speech Volume:Normal  Handedness:Right   Mood and Affect  Mood:Anxious  Affect:Congruent   Thought Process  Thought Processes:Coherent  Descriptions of Associations:Intact  Orientation:Full (Time, Place and Person)  Thought Content:Paranoid Ideation  Diagnosis of Schizophrenia or Schizoaffective disorder in past: Yes  Duration of Psychotic Symptoms: Less than six months   Hallucinations:Hallucinations: Auditory Description of Auditory Hallucinations: voices command in nature tells her to "stab clients in the neck" or to "stand in places for one hour"  Ideas of Reference:Paranoia  Suicidal Thoughts:Suicidal Thoughts: No  Homicidal Thoughts:Homicidal Thoughts: No   Sensorium  Memory:Immediate Good; Recent Good; Remote Good  Judgment:Good  Insight:Good   Executive Functions  Concentration:Fair  Attention Span:Fair  [place]   Psychomotor Activity  Psychomotor Activity:Psychomotor Activity: Normal   Assets  Assets:Communication Skills; Desire for Improvement; Financial Resources/Insurance; Housing; Physical Health; Resilience; Social Support   Sleep  Sleep:Sleep: Fair   Nutritional Assessment  (For OBS and FBC admissions only) Has the patient had a weight loss or gain of 10 pounds or more in the last 3 months?: No Has the patient had a decrease in food intake/or appetite?: No Does the patient have dental problems?: No Does the patient have eating habits or behaviors that may be indicators of an eating disorder including binging or inducing vomiting?: No Has the patient recently lost weight without trying?: 0 Has the patient been eating poorly because of a decreased appetite?: 0 Malnutrition Screening Tool Score: 0    Physical Exam  Physical Exam Vitals and nursing note reviewed.  Constitutional:      General: She is not in acute distress.    Appearance: Normal appearance. She is well-developed.  HENT:     Head: Normocephalic.  Eyes:     Conjunctiva/sclera: Conjunctivae normal.  Cardiovascular:     Rate and Rhythm: Normal rate.     Heart sounds: No murmur heard. Pulmonary:     Effort: Pulmonary effort is normal.  Musculoskeletal:        General: Normal range of motion.     Cervical back: Normal range of motion and neck supple.  Skin:    Coloration: Skin is not jaundiced or pale.  Neurological:     Mental Status: She is alert and oriented to person, place, and time.  Psychiatric:        Attention and Perception: Attention normal. She perceives auditory hallucinations.        Mood and Affect: Mood is anxious and depressed.        Speech: Speech normal.        Behavior: Behavior is cooperative.        Thought Content: Thought content is paranoid.        Cognition and Memory: Cognition normal.    Review of Systems  Constitutional: Negative.   HENT: Negative.    Eyes: Negative.   Respiratory: Negative.    Cardiovascular: Negative.   Musculoskeletal: Negative.   Skin: Negative.   Neurological: Negative.   Psychiatric/Behavioral:  Positive for depression and hallucinations. The patient is nervous/anxious.    Blood pressure 138/65, pulse 80, temperature 98.3 F  (36.8 C), temperature source Oral, resp. rate 18, SpO2  98 %. There is no height or weight on file to calculate BMI.  Demographic Factors:    Disposition: Patient has been accepted awaiting for inpatient psychiatric admission.  She will be discharged and transported via safe transport.  Revonda Humphrey, NP 05/13/2022, 7:53 AM

## 2022-05-20 ENCOUNTER — Telehealth (HOSPITAL_COMMUNITY): Payer: Self-pay | Admitting: Infectious Diseases

## 2022-05-20 NOTE — BH Assessment (Signed)
Care Management - Follow Up [facility] Discharges   Patient has been placed in an inpatient psychiatric hospital [facility]) on 05-12-2022.

## 2022-06-01 ENCOUNTER — Encounter: Payer: Medicare Other | Attending: Infectious Diseases | Admitting: *Deleted

## 2022-06-01 ENCOUNTER — Encounter: Payer: Self-pay | Admitting: *Deleted

## 2022-06-01 VITALS — BP 116/78 | Ht 67.0 in | Wt 253.8 lb

## 2022-06-01 DIAGNOSIS — E1165 Type 2 diabetes mellitus with hyperglycemia: Secondary | ICD-10-CM | POA: Diagnosis not present

## 2022-06-01 DIAGNOSIS — Z713 Dietary counseling and surveillance: Secondary | ICD-10-CM | POA: Diagnosis not present

## 2022-06-01 DIAGNOSIS — Z794 Long term (current) use of insulin: Secondary | ICD-10-CM | POA: Diagnosis not present

## 2022-06-01 DIAGNOSIS — E782 Mixed hyperlipidemia: Secondary | ICD-10-CM | POA: Diagnosis not present

## 2022-06-01 DIAGNOSIS — D219 Benign neoplasm of connective and other soft tissue, unspecified: Secondary | ICD-10-CM | POA: Diagnosis not present

## 2022-06-01 NOTE — Progress Notes (Signed)
Pt here for Initial appt for Diabetes education. After 20 minutes the patient reported that she had to leave for an important phone call around 2 pm about her medications. Was not able to discuss meal planning. She reports she will be able to return tomorrow to complete initial visit.

## 2022-06-02 ENCOUNTER — Ambulatory Visit: Payer: Medicaid Other | Admitting: *Deleted

## 2022-06-04 ENCOUNTER — Encounter: Payer: Self-pay | Admitting: Obstetrics and Gynecology

## 2022-06-04 ENCOUNTER — Encounter: Payer: Medicare Other | Attending: Infectious Diseases | Admitting: *Deleted

## 2022-06-04 DIAGNOSIS — Z713 Dietary counseling and surveillance: Secondary | ICD-10-CM | POA: Diagnosis not present

## 2022-06-04 DIAGNOSIS — E1165 Type 2 diabetes mellitus with hyperglycemia: Secondary | ICD-10-CM | POA: Diagnosis present

## 2022-06-04 DIAGNOSIS — Z794 Long term (current) use of insulin: Secondary | ICD-10-CM | POA: Insufficient documentation

## 2022-06-04 NOTE — Patient Instructions (Signed)
Check blood sugars 2 x day before breakfast and 2 hrs after supper every day Bring blood sugar records to the next appointment  Exercise:  Continue kick boxing for    30  minutes   2  days a week and gradually increase to 150 minutes a week  Eat 3 meals day,   1-2  snacks a day Space meals 4-6 hours apart Include 1 serving of protein when eating fruit for a snack or meal  Complete 3 Day Food Record and bring to next appt  Carry fast acting glucose and a snack at all times  Rotate injection sites Hold insulin pen in place for 5-10 seconds after injection Leave insulin out of refrigerator after you starting using it  Return for appointment on:  Monday July 27, 2022 at 9:45 am with [facility] (dietitian)

## 2022-06-04 NOTE — Progress Notes (Signed)
Diabetes Self-Management Education  Visit Type: First/Initial (Completion of Initial Visit on 06/01/2022)  Appt. Start Time: 0910 Appt. End Time: 0945  06/04/2022  [name], identified by name and date of birth, is a 41 y.o. female with a diagnosis of Diabetes: Type 2   ASSESSMENT  There were no vitals taken for this visit. See note from 05/27/2022 There is no height or weight on file to calculate BMI. See note from 05/27/2022   Diabetes Self-Management Education - 06/04/22 0956       Visit Information   Visit Type First/Initial   Completion of Initial Visit on 06/01/2022     Pre-Education Assessment   Patient understands the diabetes disease and treatment process. Needs Review    Patient understands incorporating nutritional management into lifestyle. Needs Instruction    Patient undertands incorporating physical activity into lifestyle. Needs Instruction    Patient understands using medications safely. Needs Review    Patient understands monitoring blood glucose, interpreting and using results Needs Review    Patient understands prevention, detection, and treatment of acute complications. Needs Instruction    Patient understands prevention, detection, and treatment of chronic complications. Needs Review    Patient understands how to develop strategies to address psychosocial issues. Needs Instruction    Patient understands how to develop strategies to promote health/change behavior. Needs Instruction      Dietary Intake   Breakfast fruit (peach, plum, strawberries, blueberries) protein shake and yogurt    Lunch egg salad and lettuce in pita pocket; tuna, salmon or chicken on salad; cottage cheese; occasional stir fry with rice, peas, carrots    Snack (afternoon) Protein bar, pretzels    Dinner fish, chicken, beef, pork [place]; potatoes, peas, beans, corn, green beans, broccoli, squash, zucchini; salads with lettuce, tomatoes, carrots, cuccumbers, cheese, crotons    Snack  (evening) sometimes cottage cheese, yogurt, sandwich    Beverage(s) water, unsweetened tea, coffee, diet soda, Crystal Light      Patient Education   Previous Diabetes Education Yes (please comment)   [place]   Disease Pathophysiology Explored patient's options for treatment of their diabetes    Healthy Eating Role of diet in the treatment of diabetes and the relationship between the three main macronutrients and blood glucose level;Food label reading, portion sizes and measuring food.;Reviewed blood glucose goals for pre and post meals and how to evaluate the patients' food intake on their blood glucose level.;Meal timing in regards to the patients' current diabetes medication.    Being Active Role of exercise on diabetes management, blood pressure control and cardiac health.    Medications Taught/reviewed insulin/injectables, injection, site rotation, insulin/injectables storage and needle disposal.;Reviewed patients medication for diabetes, action, purpose, timing of dose and side effects.    Monitoring Purpose and frequency of SMBG.;Taught/discussed recording of test results and interpretation of SMBG.;Identified appropriate SMBG and/or A1C goals.    Acute complications Taught prevention, symptoms, and  treatment of hypoglycemia - the 15 rule.    Chronic complications Relationship between chronic complications and blood glucose control    Diabetes Stress and Support Identified and addressed patients feelings and concerns about diabetes      Individualized Goals (developed by patient)   Reducing Risk Other (comment)   improve blood sugars, decrease medications, prevent diabetes complications, lose weight, lead a healthier lifestyle, quit smoking, become more fit     Outcomes   Expected Outcomes Demonstrated interest in learning but significant barriers to change    Future DMSE 2 months  Individualized Plan for Diabetes Self-Management Training:   Learning Objective:  Patient will  have a greater understanding of diabetes self-management. Patient education plan is to attend individual and/or group sessions per assessed needs and concerns.   Plan:   Patient Instructions  Check blood sugars 2 x day before breakfast and 2 hrs after supper every day Bring blood sugar records to the next appointment  Exercise:  Continue kick boxing for    30  minutes   2  days a week and gradually increase to 150 minutes a week  Eat 3 meals day,   1-2  snacks a day Space meals 4-6 hours apart Include 1 serving of protein when eating fruit for a snack or meal  Complete 3 Day Food Record and bring to next appt  Carry fast acting glucose and a snack at all times  Rotate injection sites Hold insulin pen in place for 5-10 seconds after injection Leave insulin out of refrigerator after you starting using it  Return for appointment on:  Monday July 27, 2022 at 9:45 am with [facility] (dietitian)   Expected Outcomes:  Demonstrated interest in learning but significant barriers to change  Education material provided: General Meal Planning Guidelines Simple Meal Plan 3 Day Food Record Glucose tablets Symptoms, causes and treatments of Hypoglycemia  If problems or questions, patient to contact team via:   Jenna Drilling, RN, [place], [place] [phone]  Future DSME appointment: 2 months July 27, 2022 with the dietitian

## 2022-07-21 ENCOUNTER — Ambulatory Visit (INDEPENDENT_AMBULATORY_CARE_PROVIDER_SITE_OTHER): Payer: Medicare Other | Admitting: Obstetrics and Gynecology

## 2022-07-21 ENCOUNTER — Encounter: Payer: Self-pay | Admitting: Obstetrics and Gynecology

## 2022-07-21 VITALS — BP 127/62 | HR 76 | Resp 16 | Ht 68.0 in | Wt 242.5 lb

## 2022-07-21 DIAGNOSIS — E119 Type 2 diabetes mellitus without complications: Secondary | ICD-10-CM

## 2022-07-21 DIAGNOSIS — D251 Intramural leiomyoma of uterus: Secondary | ICD-10-CM | POA: Diagnosis not present

## 2022-07-21 DIAGNOSIS — K760 Fatty (change of) liver, not elsewhere classified: Secondary | ICD-10-CM

## 2022-07-21 DIAGNOSIS — Z1231 Encounter for screening mammogram for malignant neoplasm of breast: Secondary | ICD-10-CM

## 2022-07-21 DIAGNOSIS — Z01411 Encounter for gynecological examination (general) (routine) with abnormal findings: Secondary | ICD-10-CM

## 2022-07-21 DIAGNOSIS — Z01419 Encounter for gynecological examination (general) (routine) without abnormal findings: Secondary | ICD-10-CM

## 2022-07-21 DIAGNOSIS — Z975 Presence of (intrauterine) contraceptive device: Secondary | ICD-10-CM | POA: Diagnosis not present

## 2022-07-21 DIAGNOSIS — Z7689 Persons encountering health services in other specified circumstances: Secondary | ICD-10-CM

## 2022-07-21 NOTE — Progress Notes (Signed)
GYNECOLOGY ANNUAL PHYSICAL EXAM PROGRESS NOTE  Subjective:    Jenna Kaufman is a 41 y.o. G69P1001 female who presents for to establish care and for an annual exam. Relocated from [place], last saw GYN there ~ 1 year ago. She reports a history of uterine fibroids, although not bothersome. The patient is not sexually active. The patient participates in regular exercise: no. Has the patient ever been transfused or tattooed?: yes. The patient reports that there is not domestic violence in her life.   The patient has the following complaints today: Currently has a IUD in place, has been in place for ~ 8 or 9 years, patient cannot remember. Thinks it is the copper IUD.  Would like IUD removed and replaced as she is uncertain of time frame (had placed in [place]).   Menstrual History: Menarche age: 54 Patient's last menstrual period was 07/18/2022 (approximate). Period Duration (Days): 3 Period Pattern: Regular Menstrual Flow: Heavy, Light Menstrual Control: Maxi pad Menstrual Control Change Freq (Hours): 1-3 Dysmenorrhea: None  Gynecologic History:  Contraception: IUD History of STI's: HSV.  Last Pap: 2022. Results were: normal.  Denies h/o abnormal pap smears. Last mammogram: 2021. Results were: normal    Upstream - 07/21/22 1504       Pregnancy Intention Screening   Does the patient want to become pregnant in the next year? No    Does the patient's partner want to become pregnant in the next year? No    Would the patient like to discuss contraceptive options today? No      Contraception Wrap Up   Current Method IUD or IUS    End Method IUD or IUS    Contraception Counseling Provided No            The pregnancy intention screening data noted above was reviewed. Potential methods of contraception were discussed. The patient elected to proceed with IUD or IUS.  OB History  Gravida Para Term Preterm AB Living  1 1 1  0 0 1  SAB IAB Ectopic Multiple Live Births  0 0  0 0 1    # Outcome Date GA Lbr Len/2nd Weight Sex Delivery Anes PTL Lv  1 Term 01/28/99   9 lb 8 oz (4.309 kg) M    LIV    Past Medical History:  Diagnosis Date   Anxiety    Depression    Diabetes mellitus without complication (HCC)    Fatty liver 07/14/2018   Fibroids    GERD (gastroesophageal reflux disease)    Hyperlipidemia    Hypertension    Schizophrenia (HCC)    Thyroid disease     Past Surgical History:  Procedure Laterality Date   ABDOMINAL SURGERY      Family History  Problem Relation Age of Onset   Diabetes Paternal Grandfather     Social History   Socioeconomic History   Marital status: Divorced    Spouse name: Not on file   Number of children: Not on file   Years of education: Not on file   Highest education level: Not on file  Occupational History   Not on file  Tobacco Use   Smoking status: Never   Smokeless tobacco: Never  Substance and Sexual Activity   Alcohol use: Yes    Comment: 1-2 x month - wine, liquor   Drug use: Never   Sexual activity: Not on file  Other Topics Concern   Not on file  Social History Narrative   Not on  file   Social Determinants of Health   Financial Resource Strain: Not on file  Food Insecurity: Not on file  Transportation Needs: Not on file  Physical Activity: Not on file  Stress: Not on file  Social Connections: Not on file  Intimate Partner Violence: Not on file    Current Outpatient Medications on File Prior to Visit  Medication Sig Dispense Refill   Cholecalciferol (VITAMIN D-3) 125 MCG (5000 UT) TABS Take 5,000 Units by mouth daily.     clotrimazole-betamethasone (LOTRISONE) cream Apply 1 Application topically daily as needed (Apply to affected area).     cyanocobalamin (VITAMIN B12) 1000 MCG tablet Take 1,000 mcg by mouth daily.     fenofibrate 160 MG tablet Take 1 tablet (160 mg total) by mouth daily.     hydrOXYzine (VISTARIL) 25 MG capsule Take 50 mg by mouth 2 (two) times daily.     ibuprofen  (ADVIL) 800 MG tablet Take 800 mg by mouth every 8 (eight) hours as needed for mild pain.     lamoTRIgine (LAMICTAL) 25 MG tablet Take 25 mg by mouth daily.     LANTUS SOLOSTAR 100 UNIT/ML Solostar Pen Inject 20 Units into the skin 2 (two) times daily.     levothyroxine (SYNTHROID) 88 MCG tablet Take 1 tablet (88 mcg total) by mouth daily at 6 (six) AM.     lithium carbonate 150 MG capsule Take 150 mg by mouth daily.     metFORMIN (GLUCOPHAGE) 1000 MG tablet Take 1,000 mg by mouth 2 (two) times daily with a meal.     Multiple Vitamin (MULTIVITAMIN WITH MINERALS) TABS tablet Take 1 tablet by mouth daily.     nystatin cream (MYCOSTATIN) Apply 1 Application topically 3 (three) times daily as needed for dry skin.     omeprazole (PRILOSEC) 40 MG capsule Take 40 mg by mouth daily.     OZEMPIC, 1 MG/DOSE, 4 MG/3ML SOPN Inject 4 mg into the skin once a week.     paliperidone (INVEGA) 6 MG 24 hr tablet Take 6 mg by mouth at bedtime.     QUEtiapine (SEROQUEL) 50 MG tablet Take 50 mg by mouth at bedtime.     SODIUM FLUORIDE 5000 PPM 1.1 % PSTE Take 1 Application by mouth daily.     traZODone (DESYREL) 50 MG tablet Take 1 tablet (50 mg total) by mouth at bedtime as needed for sleep.     valACYclovir (VALTREX) 1000 MG tablet Take 1,000 mg by mouth daily.     No current facility-administered medications on file prior to visit.    Allergies  Allergen Reactions   Penicillins Anaphylaxis, Hives and Rash     Review of Systems Constitutional: negative for chills, fatigue, fevers and sweats Eyes: negative for irritation, redness and visual disturbance Ears, nose, mouth, throat, and face: negative for hearing loss, nasal congestion, snoring and tinnitus Respiratory: negative for asthma, cough, sputum Cardiovascular: negative for chest pain, dyspnea, exertional chest pressure/discomfort, irregular heart beat, palpitations and syncope Gastrointestinal: negative for abdominal pain, change in bowel habits,  nausea and vomiting Genitourinary: negative for abnormal menstrual periods, genital lesions, sexual problems and vaginal discharge, dysuria and urinary incontinence Integument/breast: negative for breast lump, breast tenderness and nipple discharge Hematologic/lymphatic: negative for bleeding and easy bruising Musculoskeletal:negative for back pain and muscle weakness Neurological: negative for dizziness, headaches, vertigo and weakness Endocrine: negative for diabetic symptoms including polydipsia, polyuria and skin dryness Allergic/Immunologic: negative for hay fever and urticaria  Objective:  Blood pressure 127/62, pulse 76, resp. rate 16, height 5\' 8"  (1.727 m), weight 242 lb 8 oz (110 kg), last menstrual period 07/18/2022.  Body mass index is 36.87 kg/m.  General Appearance:    Alert, cooperative, no distress, appears stated age, moderate obesity  Head:    Normocephalic, without obvious abnormality, atraumatic  Eyes:    PERRL, conjunctiva/corneas clear, EOM's intact, both eyes  Ears:    Normal external ear canals, both ears  Nose:   Nares normal, septum midline, mucosa normal, no drainage or sinus tenderness  Throat:   Lips, mucosa, and tongue normal; teeth and gums normal  Neck:   Supple, symmetrical, trachea midline, no adenopathy; thyroid: no enlargement/tenderness/nodules; no carotid bruit or JVD  Back:     Symmetric, no curvature, ROM normal, no CVA tenderness  Lungs:     Clear to auscultation bilaterally, respirations unlabored  Chest Wall:    No tenderness or deformity   Heart:    Regular rate and rhythm, S1 and S2 normal, no murmur, rub or gallop  Breast Exam:    No tenderness, masses, or nipple abnormality  Abdomen:     Soft, non-tender, bowel sounds active all four quadrants, no masses, no organomegaly.    Genitalia:    Pelvic:external genitalia normal, vagina without lesions, discharge, or tenderness, rectovaginal septum  normal. Cervix normal in appearance, no cervical  motion tenderness, no adnexal masses or tenderness.  Uterus normal size, shape, mobile, regular contours, nontender.  Rectal:    Normal external sphincter.  No hemorrhoids appreciated. Internal exam not done.   Extremities:   Extremities normal, atraumatic, no cyanosis or edema  Pulses:   2+ and symmetric all extremities  Skin:   Skin color, texture, turgor normal, no rashes or lesions  Lymph nodes:   Cervical, supraclavicular, and axillary nodes normal  Neurologic:   CNII-XII intact, normal strength, sensation and reflexes throughout   .  Labs:  Lab Results  Component Value Date   WBC 12.8 (H) 05/12/2022   HGB 13.1 05/12/2022   HCT 39.1 05/12/2022   MCV 89.1 05/12/2022   PLT 341 05/12/2022    Lab Results  Component Value Date   CREATININE 0.56 05/12/2022   BUN 8 05/12/2022   NA 138 05/12/2022   K 4.1 05/12/2022   CL 103 05/12/2022   CO2 25 05/12/2022    Lab Results  Component Value Date   ALT 60 (H) 05/12/2022   AST 47 (H) 05/12/2022   ALKPHOS 80 05/12/2022   BILITOT 0.5 05/12/2022    Lab Results  Component Value Date   TSH 1.003 05/12/2022    Lab Results  Component Value Date   HGBA1C 9.3 (H) 05/12/2022     Assessment:   1. Well woman exam with routine gynecological exam   2. Breast cancer screening by mammogram   3. Fatty liver   4. Intramural leiomyoma of uterus   5. Type 2 diabetes mellitus without complication, without long-term current use of insulin (HCC)   6. IUD (intrauterine device) in place      Plan:  Blood tests: labs performed 1 month ago with PCP. Breast self exam technique reviewed and patient encouraged to perform self-exam monthly. Contraception: IUD. Discussed that if in place for 8-9 years, still had at least 1 more year before needing to be replaced. Will also work to get records to confirm insertion date.  Discussed healthy lifestyle modifications. Mammogram ordered.  Pap smear  UTD .  Diabetes and  fatty liver managed by PCP.   Fibroid uterus, patient brought report of last ultrasound, noting 4.5 cm intramural fibroid. Not bothersome to patient. No intervention necessary at this time. Follow up in 1 year for annual exam   Hildred Laser, MD [facility]

## 2022-07-22 ENCOUNTER — Encounter: Payer: Self-pay | Admitting: Obstetrics and Gynecology

## 2022-07-22 MED ORDER — NYSTATIN 100000 UNIT/GM EX POWD: 1.0000 | Freq: Two times a day (BID) | CUTANEOUS | 6 refills | Status: DC

## 2022-07-22 MED ORDER — NYSTATIN 100000 UNIT/GM EX CREA: 1.0000 | TOPICAL_CREAM | Freq: Two times a day (BID) | CUTANEOUS | 2 refills | Status: DC

## 2022-07-22 NOTE — Addendum Note (Signed)
Addended by: Augusto Gamble on: 07/22/2022 10:24 AM   Modules accepted: Orders

## 2022-07-27 ENCOUNTER — Ambulatory Visit: Payer: Medicare Other | Admitting: Dietician

## 2022-08-04 NOTE — Progress Notes (Deleted)
    GYNECOLOGY OFFICE PROCEDURE NOTE  Analyn Matusek is a 41 y.o. G1P1001 here for Paraguard IUD removal. No GYN concerns.  Last pap smear was on 2022 and was normal.  IUD Removal and Reinsertion  Patient identified, informed consent performed, consent signed.   Discussed risks of irregular bleeding, cramping, infection, malpositioning or misplacement of the IUD outside the uterus which may require further procedures. Also discussed >99% contraception efficacy, increased risk of ectopic pregnancy with failure of method.   Emphasized that this did not protect against STIs, condoms recommended during all sexual encounters.Advised to use backup contraception for one week as the risk of pregnancy is higher during the transition period of removing an IUD and replacing it with another one. Time out was performed. Speculum placed in the vagina. The strings of the IUD were grasped and pulled using ring forceps. The IUD was successfully removed in its entirety. The cervix was cleaned with Betadine x 2 and grasped anteriorly with a single tooth tenaculum.  The new *** IUD insertion apparatus was used to sound the uterus to *** cm;  the IUD was then placed per manufacturer's recommendations. Strings trimmed to 3 cm. Tenaculum was removed, good hemostasis noted. Patient tolerated procedure well.   Patient was given post-procedure instructions.  She was reminded to have backup contraception for one week during this transition period between IUDs.  Patient was also asked to check IUD strings periodically and follow up in 4 weeks for IUD check.   Rubie Maid, MD [place]

## 2022-08-05 ENCOUNTER — Ambulatory Visit: Payer: Medicare Other | Admitting: Obstetrics and Gynecology

## 2022-08-05 DIAGNOSIS — Z30433 Encounter for removal and reinsertion of intrauterine contraceptive device: Secondary | ICD-10-CM

## 2022-08-07 ENCOUNTER — Encounter: Payer: Self-pay | Admitting: Dietician

## 2022-08-07 NOTE — Progress Notes (Signed)
Have not heard back from patient to reschedule her missed appointment form 07/27/22. Sent notification to referring provider.

## 2022-09-10 ENCOUNTER — Ambulatory Visit: Payer: Medicare Other | Admitting: Obstetrics and Gynecology

## 2022-09-10 NOTE — Progress Notes (Deleted)
    GYNECOLOGY OFFICE PROCEDURE NOTE  Jenna Kaufman is a 41 y.o. G1P1001 here for Paraguard IUD removal and reinsertion. No GYN concerns.  Last pap smear was on *** and was normal.  IUD Removal and Reinsertion  Patient identified, informed consent performed, consent signed.   Discussed risks of irregular bleeding, cramping, infection, malpositioning or misplacement of the IUD outside the uterus which may require further procedures. Also discussed >99% contraception efficacy, increased risk of ectopic pregnancy with failure of method.   Emphasized that this did not protect against STIs, condoms recommended during all sexual encounters.Advised to use backup contraception for one week as the risk of pregnancy is higher during the transition period of removing an IUD and replacing it with another one. Time out was performed. Speculum placed in the vagina. The strings of the IUD were grasped and pulled using ring forceps. The IUD was successfully removed in its entirety. The cervix was cleaned with Betadine x 2 and grasped anteriorly with a single tooth tenaculum.  The new *** IUD insertion apparatus was used to sound the uterus to *** cm;  the IUD was then placed per manufacturer's recommendations. Strings trimmed to 3 cm. Tenaculum was removed, good hemostasis noted. Patient tolerated procedure well.   Patient was given post-procedure instructions.  She was reminded to have backup contraception for one week during this transition period between IUDs.  Patient was also asked to check IUD strings periodically and follow up in 4 weeks for IUD check.   Chilton Greathouse, CMA [facility]

## 2022-10-13 ENCOUNTER — Ambulatory Visit
Admission: EM | Admit: 2022-10-13 | Discharge: 2022-10-13 | Disposition: A | Discharge status: Home / Self Care | Payer: 59

## 2022-10-13 DIAGNOSIS — B372 Candidiasis of skin and nail: Secondary | ICD-10-CM

## 2022-10-13 MED ORDER — NYSTATIN 100000 UNIT/GM EX CREA: TOPICAL_CREAM | CUTANEOUS | 2 refills | Status: AC

## 2022-10-13 NOTE — ED Triage Notes (Addendum)
Patient to Urgent Care with complaints of rash present to lower abdomen. Reports yellow film across rash. Rash is sore, red and irritated.  Reports rash reoccurred two weeks ago. Has dealt with rash over 5 years (has had difficulty taking prescribed medications due to the price).   Was seen by her pcp this week but reports that the cream that was prescribed was not helpful (terbafine hydrocloride cream 1%). Also prescribed five days of terbinafine '250mg'$  orally. In the past has used nystatin cream, and clotrimazole/ betamethsone dipropionate cream.

## 2022-10-13 NOTE — Discharge Instructions (Addendum)
Use the Nystatin cream as directed.  Follow up with your primary care provider or a dermatologist.

## 2022-10-13 NOTE — ED Provider Notes (Signed)
Jenna Kaufman    CSN: [id number] Arrival date & time: 10/13/22  1812      History   Chief Complaint Chief Complaint  Patient presents with   Rash    Entered by patient    HPI Jenna Kaufman is a 42 y.o. female.  Patient presents with a recurrent tender red rash on her lower abdomen x 5 years.  It began again 2 weeks ago.  She was seen by her PCP at [facility] clinic on 10/09/2022; diagnosed with tinea corporis, schizophrenia, diabetes, severe obesity; treated with terbinafine oral and cream. Per her chart note on that date, the rash has previously been treated with clotrimazole and Lotrisone. Patient reports she took the oral terbinafine and used the cream without improvement.  She denies drainage, fever, chills, or other symptoms.  Her medical history includes diabetes, hypertension, hyperlipidemia, thyroid disease, GERD, fatty liver, bipolar disorder, schizophrenia.  The history is provided by the patient and medical records.    Past Medical History:  Diagnosis Date   Anxiety    Depression    Diabetes mellitus without complication ([place])    Fatty liver 07/14/2018   Fibroids    GERD (gastroesophageal reflux disease)    Hyperlipidemia    Hypertension    Schizophrenia ([place])    Thyroid disease     Patient Active Problem List   Diagnosis Date Noted   Hypothyroidism 05/05/2022   Mixed hyperlipidemia 05/05/2022   Vitamin B12 deficiency 05/05/2022   Fibroid 05/05/2022   Bipolar disorder ([place]) 09/11/2021   Essential hypertension 09/11/2021   Dyspareunia, female 09/11/2021   Fatty liver 07/14/2018    Past Surgical History:  Procedure Laterality Date   ABDOMINAL SURGERY     LAPAROSCOPIC GASTRIC BANDING     LAPAROSCOPIC GASTRIC SLEEVE RESECTION      OB History     Gravida  1   Para  1   Term  1   Preterm      AB      Living  1      SAB      IAB      Ectopic      Multiple      Live Births  1            Home Medications    Prior to  Admission medications   Medication Sig Start Date End Date Taking? Authorizing Provider  Continuous Blood Gluc Sensor (FREESTYLE LIBRE 3 SENSOR) MISC PLACE ONE SENSOR TO THE BACK OF YOUR UPPER ARM. REPLACE EVERY 14 DAYS. 10/09/22  Yes [provider]  nystatin cream (MYCOSTATIN) Apply to affected area 2 times daily 10/13/22  Yes Sharion Balloon, NP  Cholecalciferol (VITAMIN D-3) 125 MCG (5000 UT) TABS Take 5,000 Units by mouth daily.    [provider]  cyanocobalamin (VITAMIN B12) 1000 MCG tablet Take 1,000 mcg by mouth daily.    [provider]  fenofibrate 160 MG tablet Take 1 tablet (160 mg total) by mouth daily. 05/13/22   Revonda Humphrey, NP  hydrOXYzine (VISTARIL) 25 MG capsule Take 50 mg by mouth 2 (two) times daily. 05/25/22   [provider]  ibuprofen (ADVIL) 800 MG tablet Take 800 mg by mouth every 8 (eight) hours as needed for mild pain.    [provider]  lamoTRIgine (LAMICTAL) 25 MG tablet Take 25 mg by mouth daily. 05/25/22   [provider]  LANTUS SOLOSTAR 100 UNIT/ML Solostar Pen Inject 20 Units into the skin 2 (  two) times daily. 04/15/22   [provider]  levothyroxine (SYNTHROID) 88 MCG tablet Take 1 tablet (88 mcg total) by mouth daily at 6 (six) AM. 05/14/22   Revonda Humphrey, NP  lithium carbonate 150 MG capsule Take 150 mg by mouth daily. 05/25/22   [provider]  metFORMIN (GLUCOPHAGE) 1000 MG tablet Take 1,000 mg by mouth 2 (two) times daily with a meal.    [provider]  Multiple Vitamin (MULTIVITAMIN WITH MINERALS) TABS tablet Take 1 tablet by mouth daily.    [provider]  omeprazole (PRILOSEC) 40 MG capsule Take 40 mg by mouth daily. 05/02/22   [provider]  OZEMPIC, 1 MG/DOSE, 4 MG/3ML SOPN Inject 4 mg into the skin once a week. 05/05/22   [provider]  paliperidone (INVEGA) 6 MG 24 hr tablet Take 6 mg by mouth at bedtime. 05/26/22   [provider]   QUEtiapine (SEROQUEL) 50 MG tablet Take 50 mg by mouth at bedtime. 05/25/22   [provider]  SODIUM FLUORIDE 5000 PPM 1.1 % PSTE Take 1 Application by mouth daily. 05/06/22   [provider]  traZODone (DESYREL) 50 MG tablet Take 1 tablet (50 mg total) by mouth at bedtime as needed for sleep. 05/13/22   Revonda Humphrey, NP  valACYclovir (VALTREX) 1000 MG tablet Take 1,000 mg by mouth daily.    [provider]    Family History Family History  Problem Relation Age of Onset   Stroke Paternal Grandfather    Diabetes Paternal Grandfather     Social History Social History   Tobacco Use   Smoking status: Never   Smokeless tobacco: Never  Vaping Use   Vaping Use: Never used  Substance Use Topics   Alcohol use: Yes    Comment: 1-2 x month - wine, liquor   Drug use: Yes    Types: Marijuana     Allergies   Penicillins   Review of Systems Review of Systems  Constitutional:  Negative for chills and fever.  Skin:  Positive for rash.  All other systems reviewed and are negative.    Physical Exam Triage Vital Signs ED Triage Vitals  Enc Vitals Group     BP      Pulse      Resp      Temp      Temp src      SpO2      Weight      Height      Head Circumference      Peak Flow      Pain Score      Pain Loc      Pain Edu?      Excl. in [place]?    No data found.  Updated Vital Signs BP 135/80   Pulse 83   Temp 97.7 F (36.5 C)   Resp 18   Ht '5\' 7"'$  (1.702 m)   Wt 235 lb (106.6 kg)   SpO2 97%   BMI 36.81 kg/m   Visual Acuity Right Eye Distance:   Left Eye Distance:   Bilateral Distance:    Right Eye Near:   Left Eye Near:    Bilateral Near:     Physical Exam Vitals and nursing note reviewed.  Constitutional:      General: She is not in acute distress.    Appearance: She is well-developed. She is not ill-appearing.  HENT:     Mouth/Throat:  Mouth: Mucous membranes are moist.  Cardiovascular:     Rate and Rhythm: Normal  rate and regular rhythm.     Heart sounds: Normal heart sounds.  Pulmonary:     Effort: Pulmonary effort is normal. No respiratory distress.     Breath sounds: Normal breath sounds.  Musculoskeletal:     Cervical back: Neck supple.  Skin:    General: Skin is warm and dry.     Findings: Rash present.     Comments: Erythematous rash in skin fold of lower abdomen and under left breast.  No drainage.   Neurological:     Mental Status: She is alert.  Psychiatric:        Mood and Affect: Mood normal.        Behavior: Behavior normal.      UC Treatments / Results  Labs (all labs ordered are listed, but only abnormal results are displayed) Labs Reviewed - No data to display  EKG   Radiology No results found.  Procedures Procedures (including critical care time)  Medications Ordered in UC Medications - No data to display  Initial Impression / Assessment and Plan / UC Course  I have reviewed the triage vital signs and the nursing notes.  Pertinent labs & imaging results that were available during my care of the patient were reviewed by me and considered in my medical decision making (see chart for details).    Candidal dermatitis.  Treating with Nystatin cream twice daily x 14-21 days.  Instructed patient to follow up with her PCP or a dermatologist.  Education provided on skin yeast rash.  She agrees to plan of care.    Final Clinical Impressions(s) / UC Diagnoses   Final diagnoses:  Candidal dermatitis     Discharge Instructions      Use the Nystatin cream as directed.  Follow up with your primary care provider or a dermatologist.        ED Prescriptions     Medication Sig Dispense Auth. Provider   nystatin cream (MYCOSTATIN) Apply to affected area 2 times daily 30 g Sharion Balloon, NP      PDMP not reviewed this encounter.   Sharion Balloon, NP 10/13/22 1859

## 2022-10-19 ENCOUNTER — Other Ambulatory Visit: Payer: Self-pay

## 2022-10-19 ENCOUNTER — Emergency Department
Admission: EM | Admit: 2022-10-19 | Discharge: 2022-10-19 | Disposition: A | Discharge status: Home / Self Care | Payer: 59 | Attending: Emergency Medicine | Admitting: Emergency Medicine

## 2022-10-19 DIAGNOSIS — E039 Hypothyroidism, unspecified: Secondary | ICD-10-CM | POA: Diagnosis not present

## 2022-10-19 DIAGNOSIS — R21 Rash and other nonspecific skin eruption: Secondary | ICD-10-CM | POA: Diagnosis present

## 2022-10-19 DIAGNOSIS — I10 Essential (primary) hypertension: Secondary | ICD-10-CM | POA: Diagnosis not present

## 2022-10-19 DIAGNOSIS — B369 Superficial mycosis, unspecified: Secondary | ICD-10-CM | POA: Diagnosis not present

## 2022-10-19 MED ORDER — KETOCONAZOLE 2 % EX CREA: 1.0000 | TOPICAL_CREAM | Freq: Every day | CUTANEOUS | 0 refills | Status: AC

## 2022-10-19 MED ORDER — FLUCONAZOLE 150 MG PO TABS: 150.0000 mg | ORAL_TABLET | ORAL | 0 refills | Status: DC

## 2022-10-19 NOTE — ED Triage Notes (Signed)
Pt presents to the ED via [facility] due to a rash that is getting worse. Pt states she has had this rash for 5 years and seen multiple doctors for this rash but its not getting any better. Pt was recently seen at the [facility] for the same rash and was given nystatin. Pt states she needs something stronger that will be covered by her insurance. Pt A&Ox4

## 2022-10-19 NOTE — ED Notes (Signed)
Went to discharge the pt and they were not in the room

## 2022-10-19 NOTE — Discharge Instructions (Addendum)
-  I suspect that you likely have a fungal skin infection.  Given that he has been resistant to the other treatments, we will try a combination of ketoconazole cream and fluconazole.  Recommend following up with dermatology using the contact information listed on this page.  Let them know that you were seen here in the emergency department.  -Return to the emergency department anytime if you begin to experience any new or worsening symptoms.

## 2022-10-19 NOTE — ED Provider Notes (Signed)
[facility] Provider Note    Event Date/Time   First MD Initiated Contact with Patient 10/19/22 1114     (approximate)   History   Chief Complaint Rash   HPI Jenna Kaufman is a 42 y.o. female, history of hypertension, hypothyroidism, bipolar disorder, anxiety, depression, schizophrenia, presents to the emergency department for evaluation of rash under the midline abdominal fold.  She states that she has had this rash for the past years and has seen multiple doctors for the rash, it is reportedly not getting any better.  She has trialed nystatin cream and clotrimazole cream, though it is not helping.  Denies fever/chills, chest pain, shortness breath, abdominal pain, nausea/vomiting, diarrhea, urinary symptoms, headache, weakness, or dizziness/lightheadedness.  History Limitations: No limitations.        Physical Exam  Triage Vital Signs: ED Triage Vitals  Enc Vitals Group     BP 10/19/22 1026 (!) 141/66     Pulse Rate 10/19/22 1026 97     Resp 10/19/22 1026 18     Temp 10/19/22 1026 98.4 F (36.9 C)     Temp Source 10/19/22 1026 Oral     SpO2 10/19/22 1026 97 %     Weight 10/19/22 1023 234 lb 12.6 oz (106.5 kg)     Height 10/19/22 1023 '5\' 7"'$  (1.702 m)     Head Circumference --      Peak Flow --      Pain Score 10/19/22 1022 5     Pain Loc --      Pain Edu? --      Excl. in [place]? --     Most recent vital signs: Vitals:   10/19/22 1026  BP: (!) 141/66  Pulse: 97  Resp: 18  Temp: 98.4 F (36.9 C)  SpO2: 97%    General: Awake, NAD.  Eyes: PERRL. Conjunctivae normal.  CV: Good peripheral perfusion.  Resp: Normal effort.  Abd: Soft, non-tender. No distention.  Neuro: At baseline. No gross neurological deficits.  Musculoskeletal: Normal ROM of all extremities.  Focused Exam: Scaly, erythematous rash with clearly defined borders along the abdominal fold, midline.  No active bleeding or discharge.  No vesicles present.  Approximately 10  cm in diameter.  No warmth or tenderness.  Physical Exam    ED Results / Procedures / Treatments  Labs (all labs ordered are listed, but only abnormal results are displayed) Labs Reviewed - No data to display   EKG N/A.    RADIOLOGY  ED Provider Interpretation: N/A.  No results found.  PROCEDURES:  Critical Care performed: N/A.  Procedures    MEDICATIONS ORDERED IN ED: Medications - No data to display   IMPRESSION / MDM / [place] / ED COURSE  I reviewed the triage vital signs and the nursing notes.                              Differential diagnosis includes, but is not limited to, contact dermatitis, candidal dermatitis, tinea corporis, cellulitis, psoriasis, eczema, allergic dermatitis.  Assessment/Plan Presentation consistent with fungal skin infection.  History not concerning for any systemic infection.  Appears to be refractory to nystatin and clotrimazole cream.  Will trial p.o. fluconazole and ketoconazole cream.  She states that she has not seen a dermatologist for this yet since it started 5 years ago.  Will provide her with a referral to [facility] dermatology.  She was agreeable for  this.  Will discharge.  Provided the patient with anticipatory guidance, return precautions, and educational material. Encouraged the patient to return to the emergency department at any time if they begin to experience any new or worsening symptoms. Patient expressed understanding and agreed with the plan.   Patient's presentation is most consistent with acute, uncomplicated illness.       FINAL CLINICAL IMPRESSION(S) / ED DIAGNOSES   Final diagnoses:  Fungal skin infection     Rx / DC Orders   ED Discharge Orders          Ordered    fluconazole (DIFLUCAN) 150 MG tablet  Weekly        10/19/22 1142    ketoconazole (NIZORAL) 2 % cream  Daily        10/19/22 1142             Note:  This document was prepared using Dragon voice recognition  software and may include unintentional dictation errors.   Teodoro Spray, [place] 10/19/22 1639    Arta Silence, MD 10/19/22 1721

## 2022-11-03 ENCOUNTER — Ambulatory Visit: Payer: 59 | Attending: Otolaryngology

## 2022-11-03 DIAGNOSIS — Z6841 Body Mass Index (BMI) 40.0 and over, adult: Secondary | ICD-10-CM | POA: Insufficient documentation

## 2022-11-03 DIAGNOSIS — R0683 Snoring: Secondary | ICD-10-CM | POA: Diagnosis present

## 2022-11-03 DIAGNOSIS — G4736 Sleep related hypoventilation in conditions classified elsewhere: Secondary | ICD-10-CM | POA: Insufficient documentation

## 2022-11-03 DIAGNOSIS — G4761 Periodic limb movement disorder: Secondary | ICD-10-CM | POA: Insufficient documentation

## 2022-11-03 DIAGNOSIS — G4733 Obstructive sleep apnea (adult) (pediatric): Secondary | ICD-10-CM | POA: Diagnosis not present

## 2023-03-19 ENCOUNTER — Ambulatory Visit: Payer: 59 | Admitting: Physician Assistant

## 2023-06-02 ENCOUNTER — Ambulatory Visit: Payer: 59 | Admitting: Obstetrics and Gynecology

## 2023-06-02 ENCOUNTER — Encounter: Payer: Self-pay | Admitting: Obstetrics and Gynecology

## 2023-06-02 VITALS — BP 116/55 | HR 72 | Resp 16 | Ht 67.0 in | Wt 218.7 lb

## 2023-06-02 DIAGNOSIS — Z30433 Encounter for removal and reinsertion of intrauterine contraceptive device: Secondary | ICD-10-CM | POA: Diagnosis not present

## 2023-06-02 MED ORDER — LEVONORGESTREL 20 MCG/DAY IU IUD
1.0000 | INTRAUTERINE_SYSTEM | Freq: Once | INTRAUTERINE | Status: AC
  Administered 2023-06-02: 1 via INTRAUTERINE

## 2023-06-02 NOTE — Patient Instructions (Signed)
IUD PLACEMENT POST-PROCEDURE INSTRUCTIONS  You may take Ibuprofen, Aleve or Tylenol for pain if needed.  Cramping should resolve within in 24 hours.  You may have a small amount of spotting.  You should wear a mini pad for the next few days.  You may have intercourse after 24 hours.  If you using this for birth control, it is effective immediately.  You need to call if you have any pelvic pain, fever, heavy bleeding or foul smelling vaginal discharge.  Irregular bleeding is common the first several months after having an IUD placed. You do not need to call for this reason unless you are concerned.  Shower or bathe as normal  You should have a follow-up appointment in 4-8 weeks for a re-check to make sure you are not having any problems     Intrauterine Device Information An intrauterine device (IUD) is a medical device that is inserted into the uterus to prevent pregnancy. It is a small, T-shaped device that has one or two nylon strings hanging down from it. The strings hang out of the lower part of the uterus (cervix) to allow for future IUD removal. There are two types of IUDs: Hormone IUD. This type of IUD is made of plastic and contains the hormone progestin (synthetic progesterone). A hormone IUD may last 3-5 years. Copper IUD. This type of IUD has copper wire wrapped around it. A copper IUD may last up to 10 years. How is an IUD inserted? An IUD is inserted through the vagina, through the cervix, and into the uterus with a minor medical procedure. The procedure for IUD insertion may vary among health care providers and hospitals. How does an IUD work? Synthetic progesterone in a hormonal IUD prevents pregnancy by: Thickening cervical mucus to prevent sperm from entering the uterus. Thinning the uterine lining to prevent a fertilized egg from being implanted there. Copper in a copper IUD prevents pregnancy by making the uterus and fallopian tubes produce a fluid that kills  sperm. What are the advantages of an IUD? Advantages of either type of IUD An IUD: Is highly effective in preventing pregnancy. Is reversible. You can become pregnant shortly after the IUD is removed. Is low-maintenance and can stay in place for a long time. Has no estrogen-related side effects. Can be used when breastfeeding. Is not associated with weight gain. Can be inserted right after childbirth, an abortion, or a miscarriage. Advantages of a hormone IUD If it is inserted within 7 days of your period starting, it works right after it has been inserted. If the hormone IUD is inserted at any other time in your cycle, you will need to use a backup method of birth control for 7 days after insertion. It can make menstrual periods lighter or stop completely. It can reduce menstrual cramping and other discomforts from menstrual periods. It can be used for 3-5 years, depending on which IUD you have. Advantages of a copper IUD It works right after it is inserted. It can be used as a form of emergency birth control if it is inserted within 5 days after having unprotected sex. It does not interfere with your body's natural hormones. It can be used for up to 10 years. What are the disadvantages of an IUD? An IUD may cause irregular menstrual bleeding for a period of time after insertion. It is common to have pain during insertion and have cramping and vaginal bleeding after insertion. An IUD may cut the uterus (uterine perforation) when it  is inserted. This is rare. Pelvic inflammatory disease (PID) may happen after insertion of an IUD. PID is an infection in the uterus and fallopian tubes. The IUD does not cause the infection. The infection is usually from an unknown sexually transmitted infection (STI). This is rare, and it usually happens during the first 20 days after the IUD is inserted. A copper IUD can make your menstrual flow heavier and more painful. IUDs cannot prevent sexually  transmitted infections (STIs). How is an IUD removed?  You will lie on your back with your knees bent and your feet in footrests (stirrups). A device will be inserted into your vagina to spread apart the vaginal walls (speculum). This will allow your health care provider to see the strings attached to the IUD. Your health care provider will use a small instrument (forceps) to grasp the IUD strings and will pull firmly until the IUD is removed. You may have some discomfort when the IUD is removed. Your health care provider may recommend taking over-the-counter pain relievers, such as ibuprofen, before the procedure. You may also have minor spotting for a few days after the procedure. The procedure for IUD removal may vary among health care providers and hospitals. Is an IUD right for me? If you are interested in an IUD, discuss it with your health care provider. He or she will make sure you are a good candidate for an IUD and will let you know more about the advantages, disadvantage, and possible side effects. This will allow you to make a decision about the device. Summary An intrauterine device (IUD) is a medical device that is inserted in the uterus to prevent pregnancy. It is a small, T-shaped device that has one or two nylon strings hanging down from it. A hormone IUD contains the hormone progestin (synthetic progesterone). A copper IUD has copper wire wrapped around it. Synthetic progesterone in a hormone IUD prevents pregnancy by thickening cervical mucus and thinning the walls of the uterus. Copper in a copper IUD prevents pregnancy by making the uterus and fallopian tubes produce a fluid that kills sperm. A hormone IUD can be left in place for 3-5 years. A copper IUD can be left in place for up to 10 years. An IUD is inserted and removed by a health care provider. You may feel some pain during insertion and removal. Your health care provider may recommend taking over-the-counter pain medicine,  such as ibuprofen, before an IUD procedure. This information is not intended to replace advice given to you by your health care provider. Make sure you discuss any questions you have with your health care provider. Document Revised: 04/03/2020 Document Reviewed: 04/03/2020 Elsevier Patient Education  2024 [employer].

## 2023-06-02 NOTE — Progress Notes (Signed)
    GYNECOLOGY OFFICE PROCEDURE NOTE  Jenna Kaufman is a 42 y.o. G1P1001 here for IUD removal and reinsertion. Unsure which type of IUD she had, may have been copper. No GYN concerns.  Last pap smear was on 2022 and was normal.  IUD Removal and Reinsertion  Patient identified, informed consent performed, consent signed.   Discussed risks of irregular bleeding, cramping, infection, malpositioning or misplacement of the IUD outside the uterus which may require further procedures. Also discussed >99% contraception efficacy, increased risk of ectopic pregnancy with failure of method.   Emphasized that this did not protect against STIs, condoms recommended during all sexual encounters.Advised to use backup contraception for one week as the risk of pregnancy is higher during the transition period of removing an IUD and replacing it with another one. Time out was performed. Speculum placed in the vagina. The strings of the IUD were grasped and pulled using ring forceps. The IUD was successfully removed in its entirety. The cervix was cleaned with Betadine x 2 and grasped anteriorly with a single tooth tenaculum.  The new Mirena IUD insertion apparatus was used to sound the uterus to 7 cm;  the IUD was then placed per manufacturer's recommendations. Strings trimmed to 3 cm. Tenaculum was removed, good hemostasis noted. Patient tolerated procedure well.   Patient was given post-procedure instructions.  She was reminded to have backup contraception for one week during this transition period between IUDs.  Patient was also asked to check IUD strings periodically and follow up in 4-6 weeks for IUD check.   Exp: [id number] Lot: 05/2025  Hildred Laser, MD [facility]

## 2023-06-24 ENCOUNTER — Ambulatory Visit: Payer: 59 | Admitting: Obstetrics and Gynecology

## 2023-06-24 NOTE — Progress Notes (Deleted)
    GYNECOLOGY OFFICE ENCOUNTER NOTE  History:  42 y.o. G1P1001 here today for today for IUD string check; Mirena  IUD was placed  06/02/23. No complaints about the IUD, no concerning side effects.  The following portions of the patient's history were reviewed and updated as appropriate: allergies, current medications, past family history, past medical history, past social history, past surgical history and problem list. Last pap smear on *** was normal, negative HRHPV.  Review of Systems:  Pertinent items are noted in HPI.  Objective:  Physical Exam There were no vitals taken for this visit. CONSTITUTIONAL: Well-developed, well-nourished female in no acute distress.  NEUROLOGIC: Alert and oriented to person, place, and time. Normal reflexes, muscle tone coordination.  ABDOMEN: Soft, no distention noted.   PELVIC: Normal appearing external genitalia; normal appearing vaginal mucosa and cervix.  IUD strings visualized, about *** cm in length outside cervix. Done in the presence of a chaperone.  EXTREMITIES: Non-tender, no edema or cyanosis  Assessment & Plan:  Patient to keep IUD in place for up to 8 years; can come in for removal if she desires pregnancy earlier or for any concerning side effects.    Hildred Laser MD [facility]

## 2023-07-12 ENCOUNTER — Other Ambulatory Visit: Payer: Self-pay | Admitting: Infectious Diseases

## 2023-07-12 DIAGNOSIS — F4489 Other dissociative and conversion disorders: Secondary | ICD-10-CM

## 2023-07-12 DIAGNOSIS — D649 Anemia, unspecified: Secondary | ICD-10-CM

## 2023-07-16 ENCOUNTER — Ambulatory Visit
Admission: RE | Admit: 2023-07-16 | Discharge: 2023-07-16 | Disposition: A | Discharge status: Home / Self Care | Payer: 59 | Source: Ambulatory Visit | Attending: Infectious Diseases | Admitting: Infectious Diseases

## 2023-07-16 DIAGNOSIS — F4489 Other dissociative and conversion disorders: Secondary | ICD-10-CM | POA: Insufficient documentation

## 2023-07-16 DIAGNOSIS — D649 Anemia, unspecified: Secondary | ICD-10-CM | POA: Insufficient documentation

## 2023-08-29 IMAGING — CR DG NECK SOFT TISSUE
2 series · 2 of 2 positions shown · non-contrast
Comparison: None.

CLINICAL DATA: Shortness of breath, possible foreign body.
Sensation of pill getting stuck.

EXAM:
NECK SOFT TISSUES - 1+ VIEW

[neck lat]
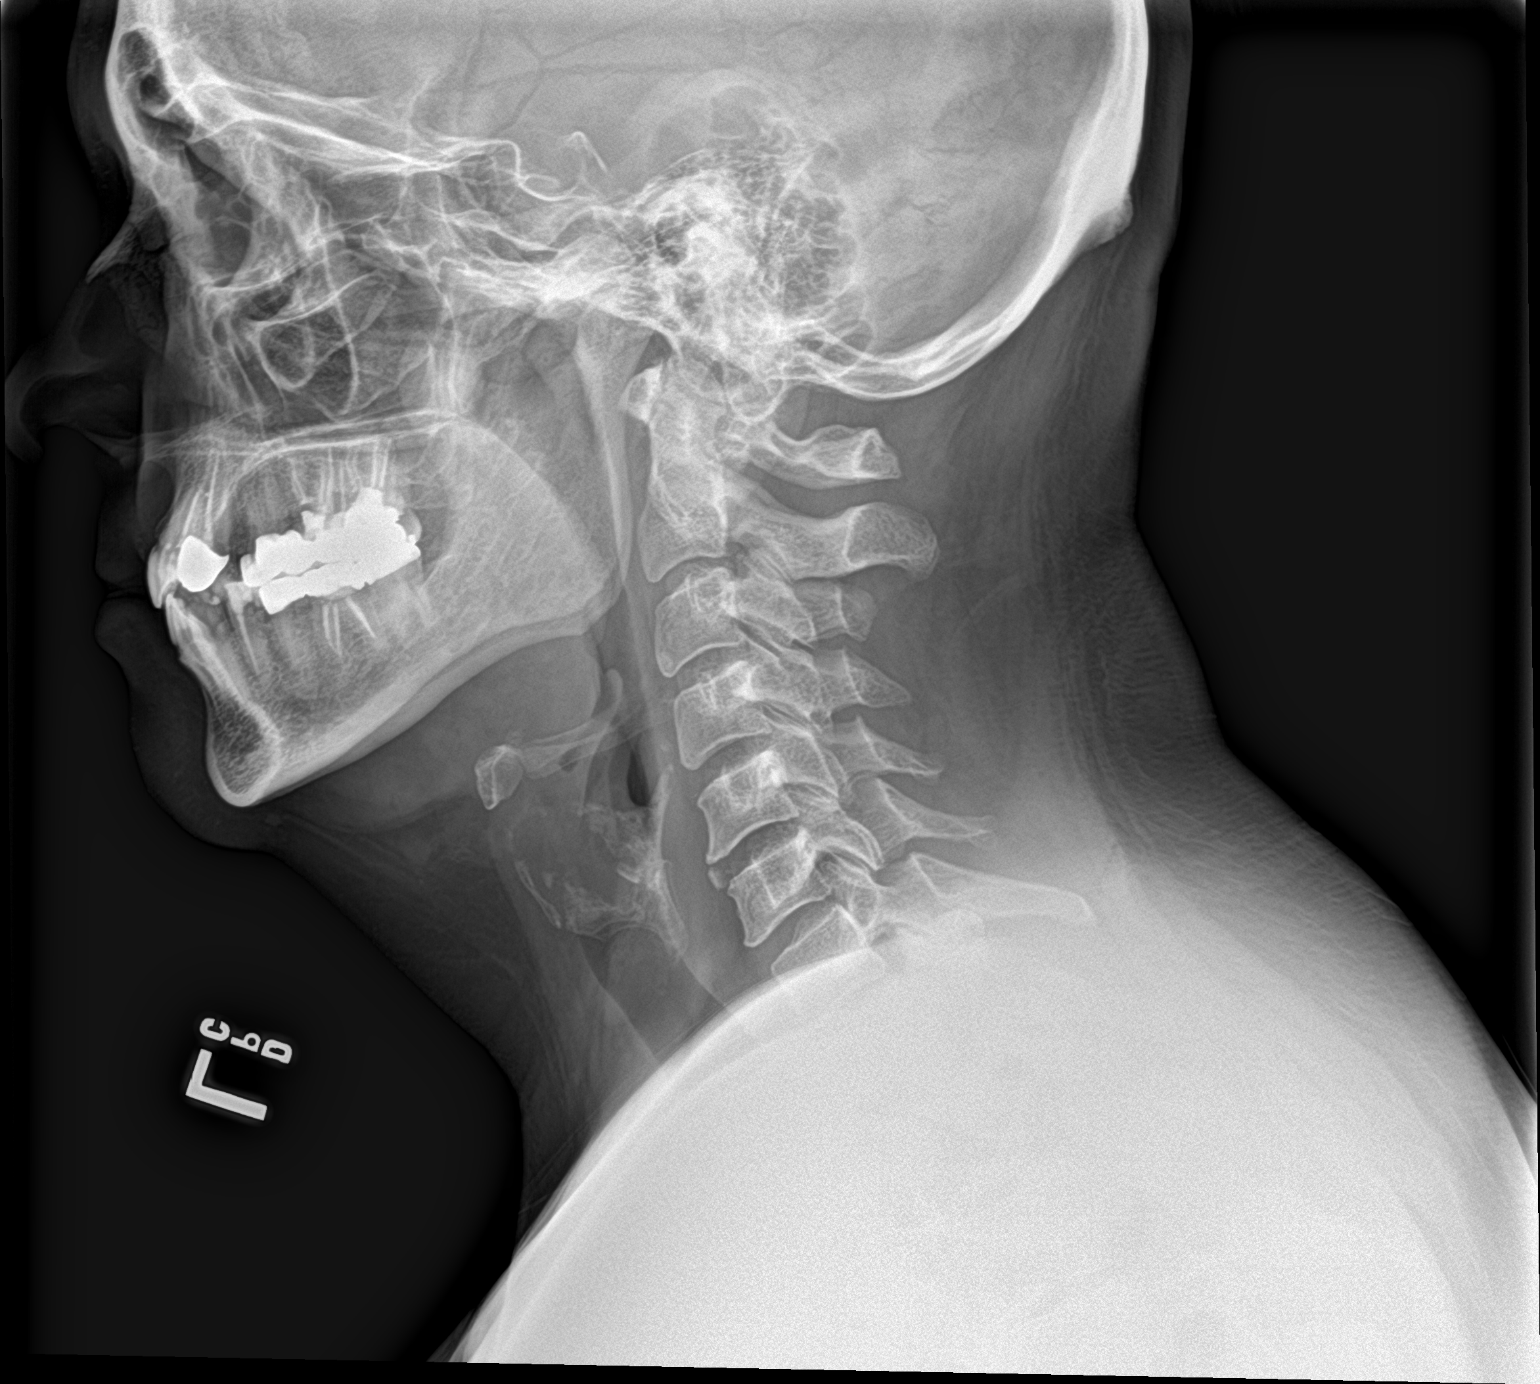

[neck ap]
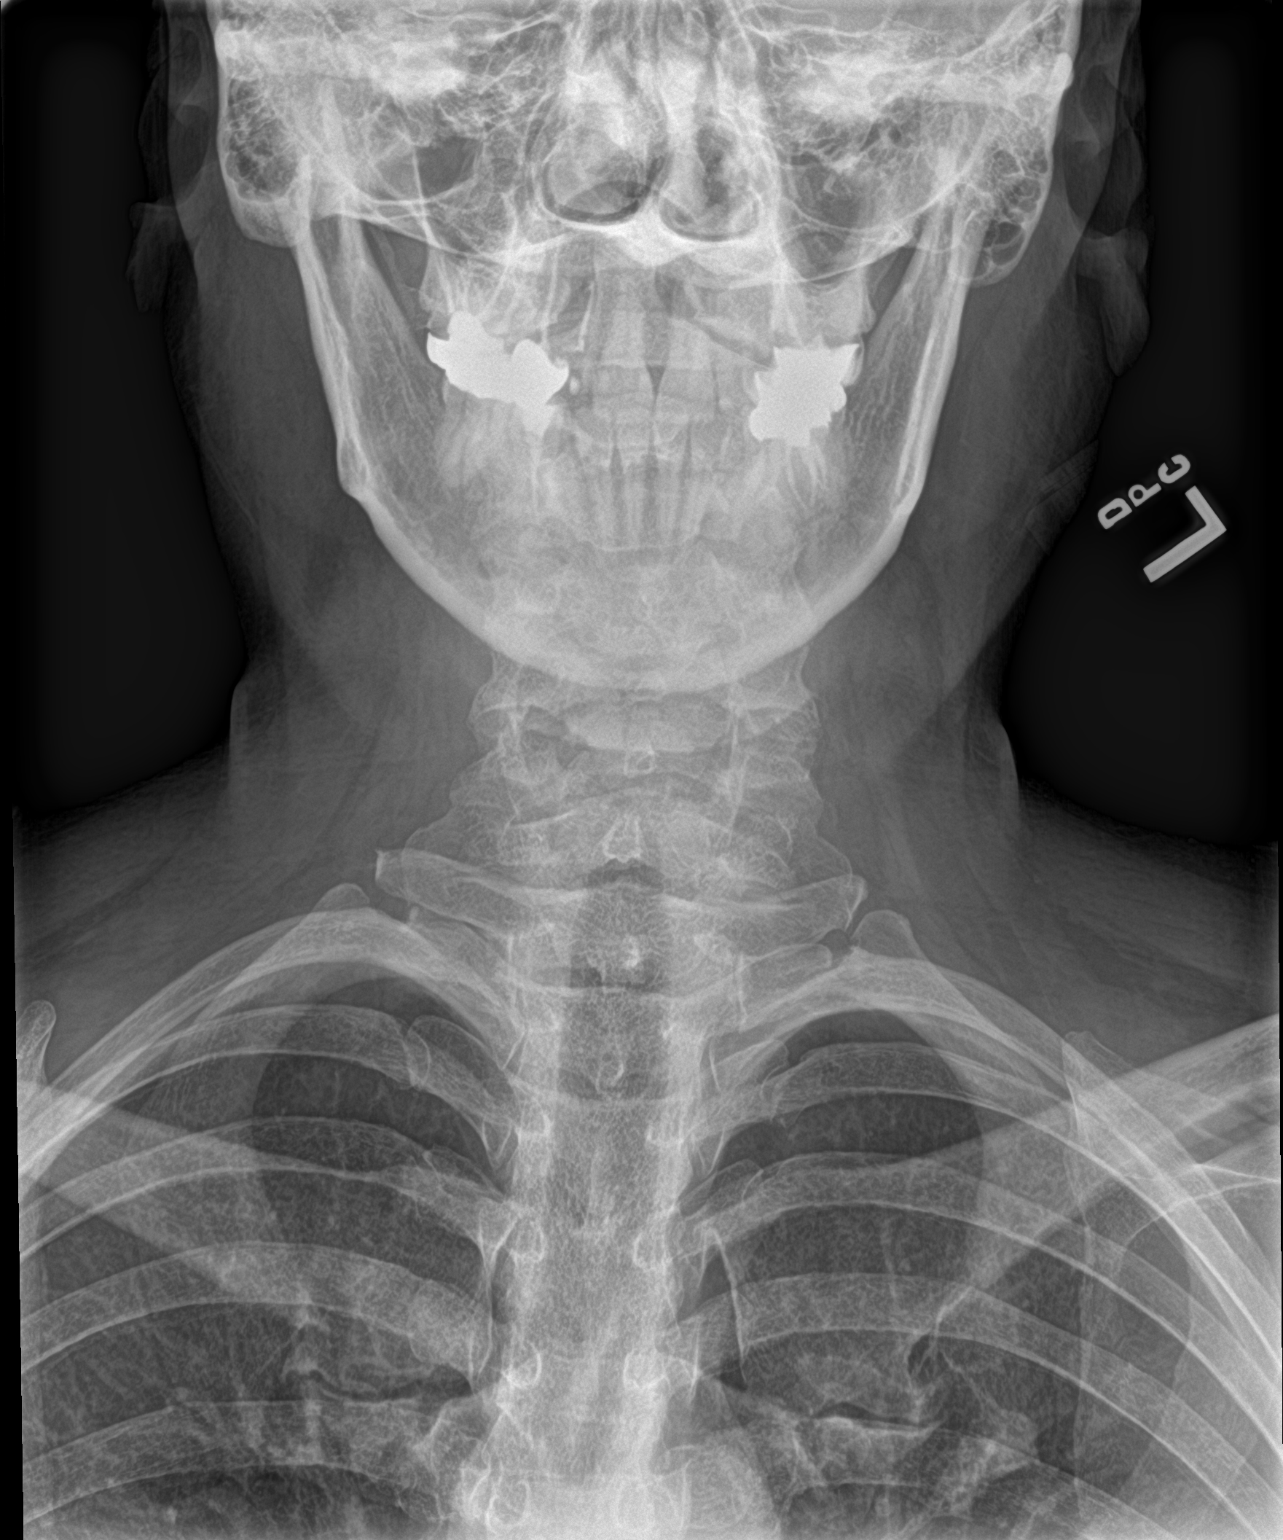

[2 of 2 positions shown; findings below may reference images not displayed]

FINDINGS: There is no evidence of retropharyngeal soft tissue swelling or
epiglottic enlargement. The cervical airway is unremarkable. No
radio-opaque foreign body identified. Soft tissue planes are normal.
IMPRESSION: Negative soft tissue neck radiographs.

## 2023-08-29 IMAGING — CR DG CHEST 2V
2 series · 2 of 2 positions shown · non-contrast
Comparison: None.

CLINICAL DATA: Shortness of breath, possible foreign body.

EXAM:
CHEST - 2 VIEW

[chest pa]
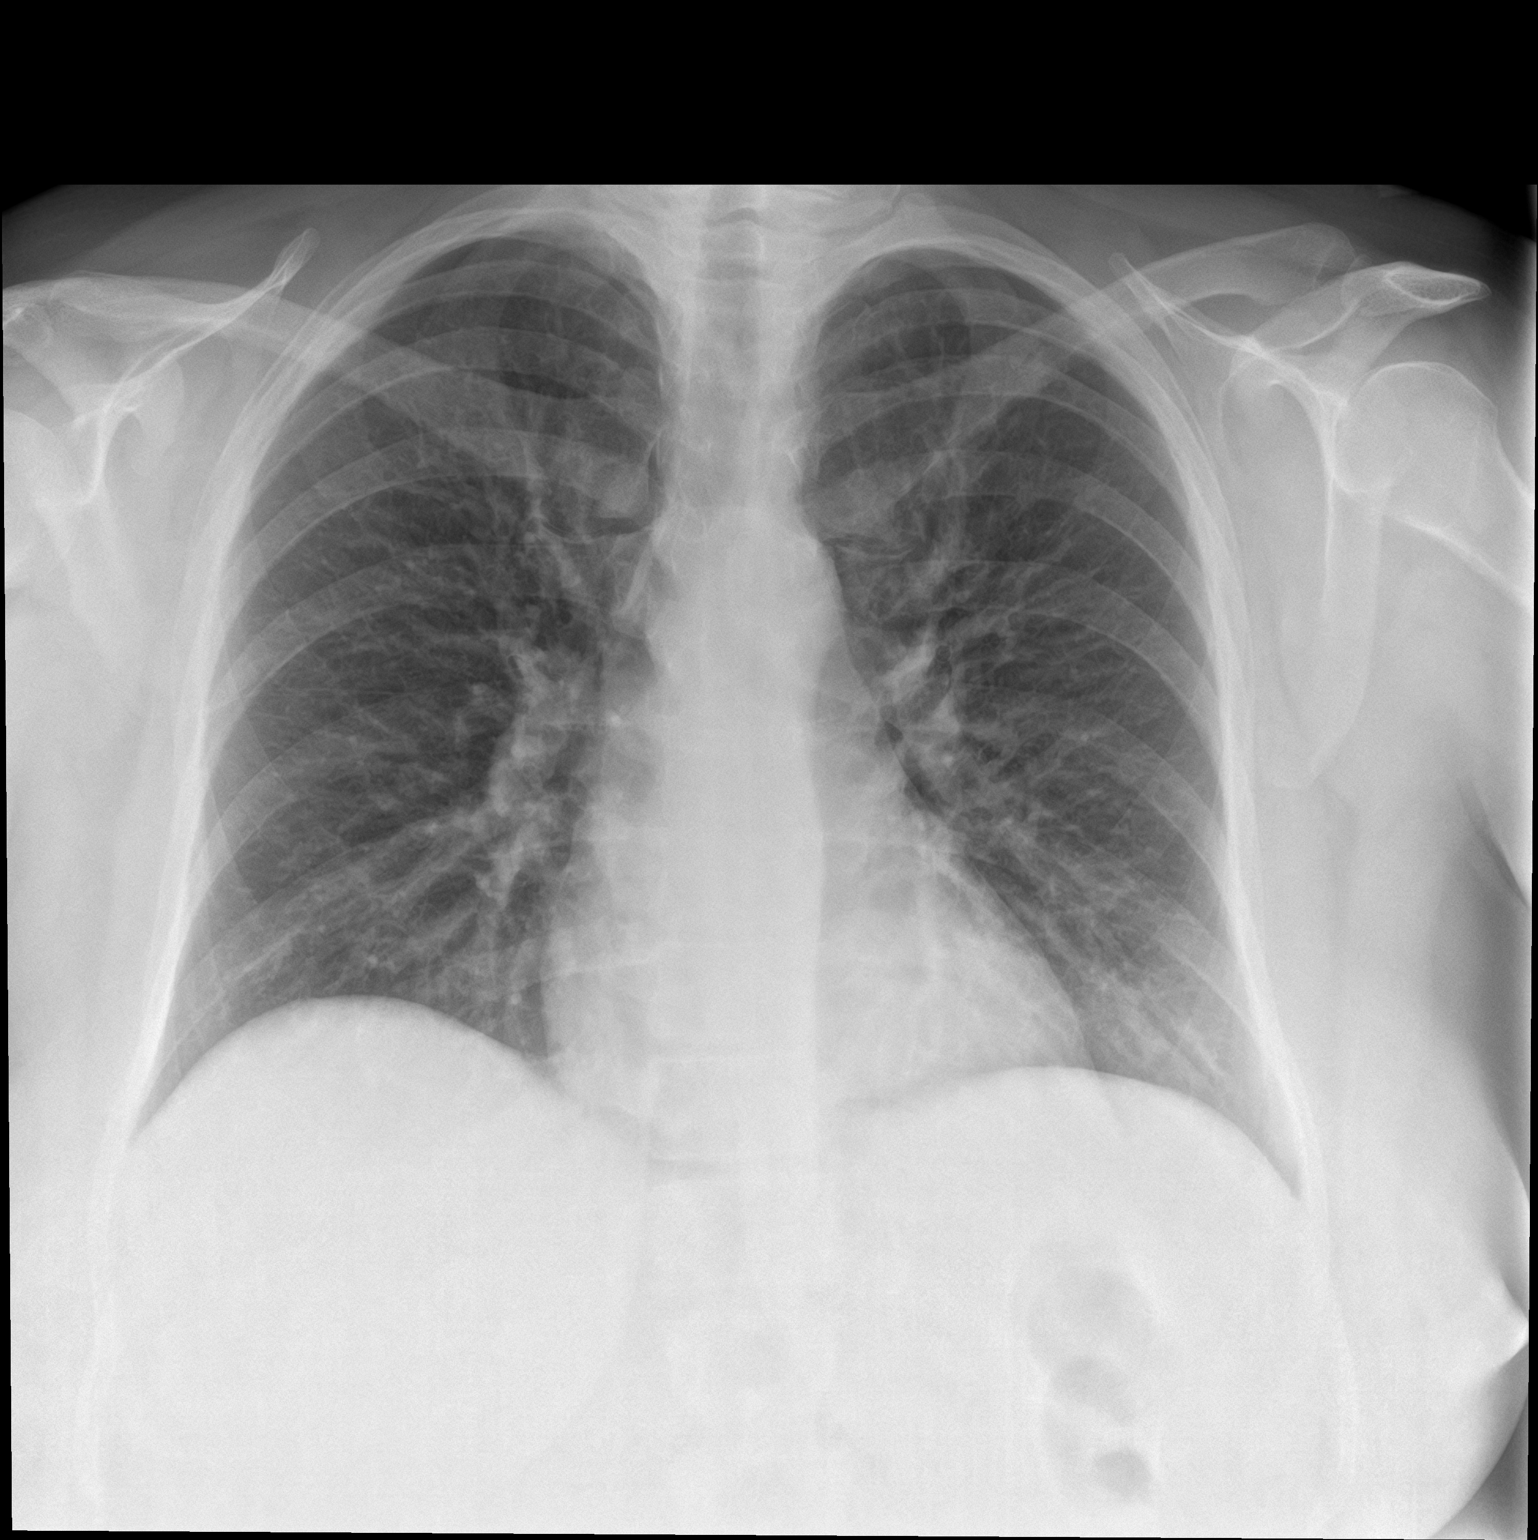

[chest lat]
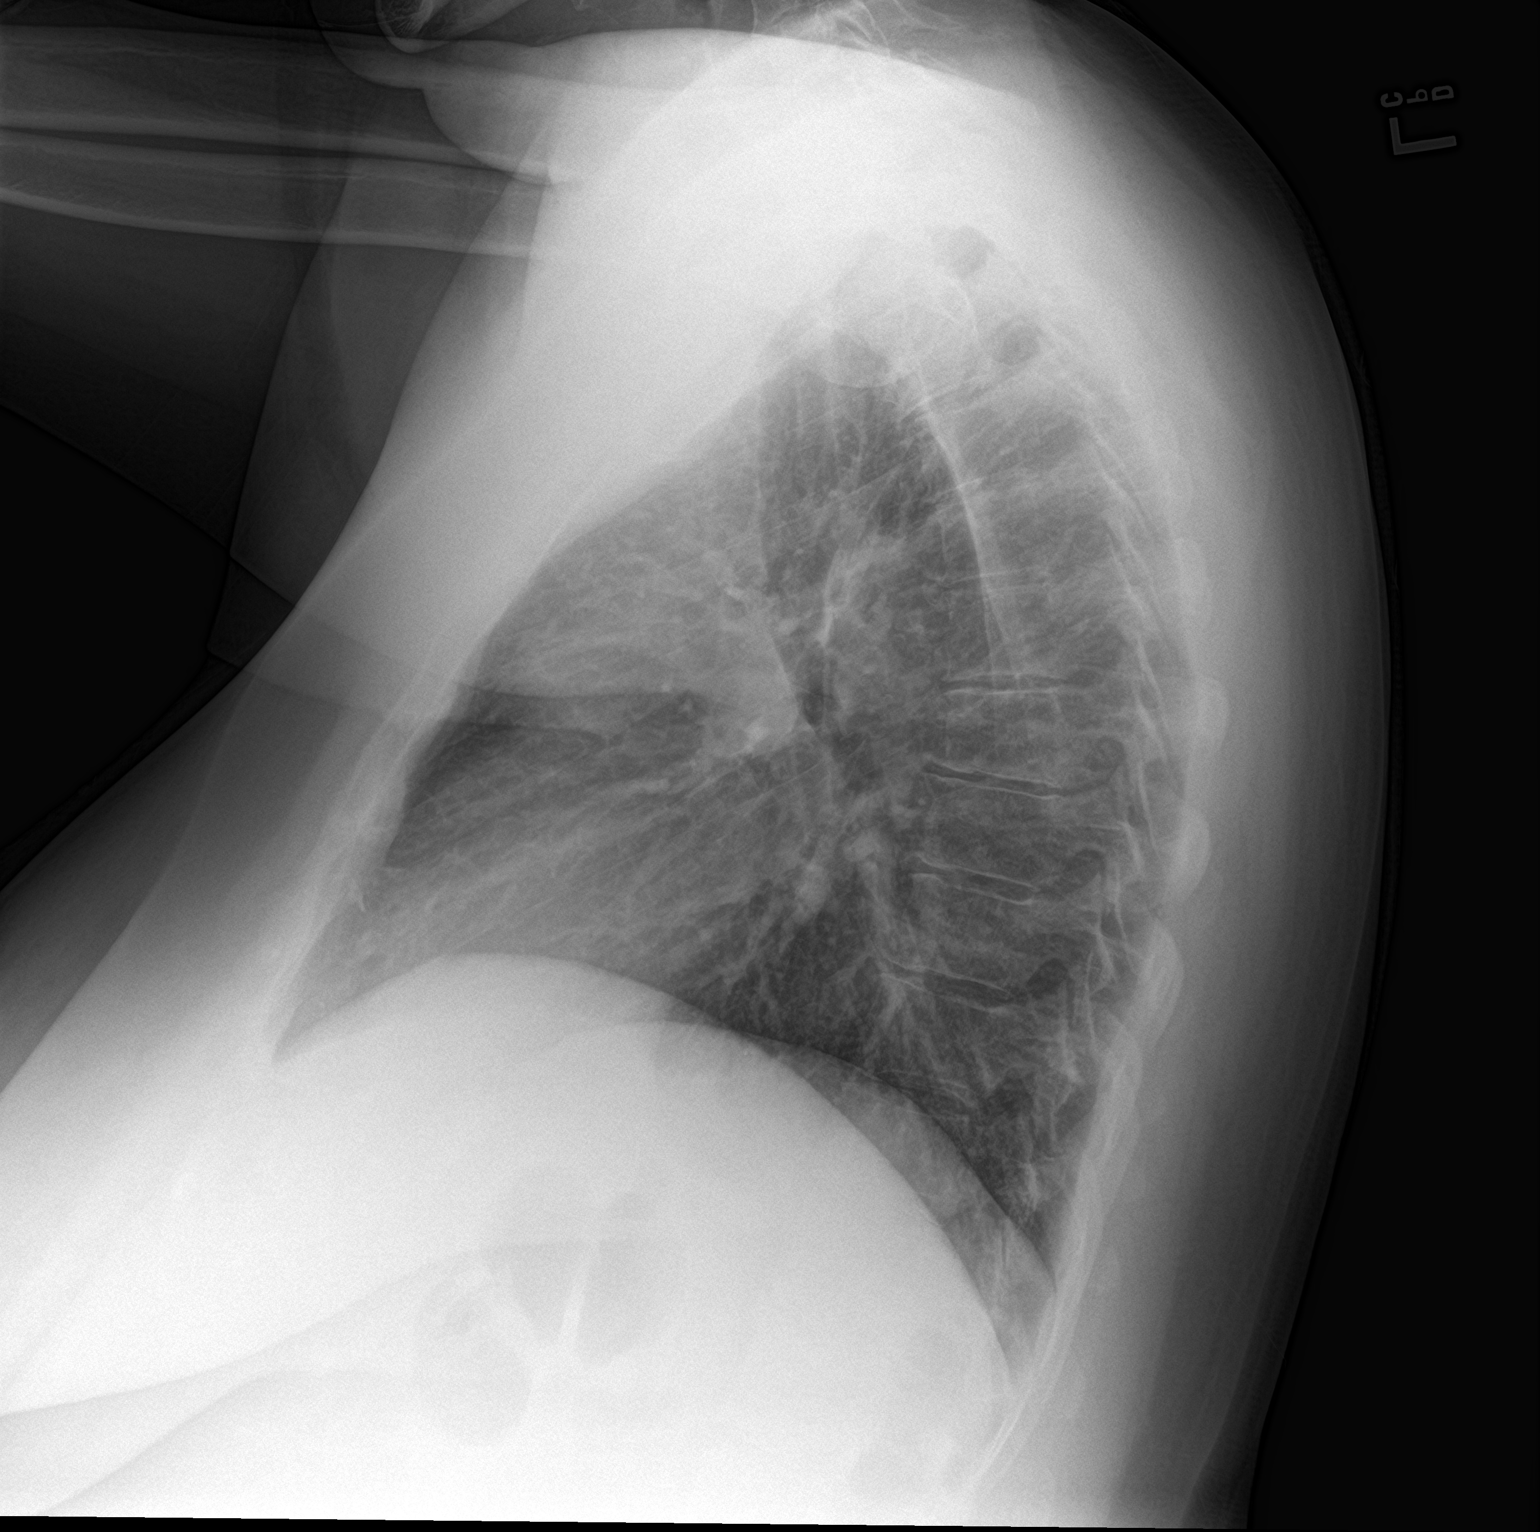

[2 of 2 positions shown; findings below may reference images not displayed]

FINDINGS: Lungs are symmetrically inflatedthe cardiomediastinal contours are
normal. The lungs are clear. Pulmonary vasculature is normal. No
consolidation, pleural effusion, or pneumothorax. No gaseous
esophageal dilatation. No pneumomediastinum or subcutaneous
emphysema. No acute osseous abnormalities are seen.
IMPRESSION: Negative radiographs of the chest.

## 2024-04-07 DIAGNOSIS — N898 Other specified noninflammatory disorders of vagina: Secondary | ICD-10-CM | POA: Diagnosis not present

## 2024-04-10 DIAGNOSIS — F331 Major depressive disorder, recurrent, moderate: Secondary | ICD-10-CM | POA: Diagnosis not present

## 2024-04-10 DIAGNOSIS — F250 Schizoaffective disorder, bipolar type: Secondary | ICD-10-CM | POA: Diagnosis not present

## 2024-04-10 DIAGNOSIS — Z1330 Encounter for screening examination for mental health and behavioral disorders, unspecified: Secondary | ICD-10-CM | POA: Diagnosis not present

## 2024-04-10 DIAGNOSIS — Z1331 Encounter for screening for depression: Secondary | ICD-10-CM | POA: Diagnosis not present

## 2024-04-10 DIAGNOSIS — F411 Generalized anxiety disorder: Secondary | ICD-10-CM | POA: Diagnosis not present

## 2024-06-06 ENCOUNTER — Ambulatory Visit
Admission: EM | Admit: 2024-06-06 | Discharge: 2024-06-06 | Disposition: A | Discharge status: Home / Self Care | Attending: Emergency Medicine | Admitting: Emergency Medicine

## 2024-06-06 DIAGNOSIS — B372 Candidiasis of skin and nail: Secondary | ICD-10-CM | POA: Diagnosis not present

## 2024-06-06 MED ORDER — CLOTRIMAZOLE-BETAMETHASONE 1-0.05 % EX CREA: TOPICAL_CREAM | CUTANEOUS | 0 refills | Status: AC

## 2024-06-06 NOTE — ED Provider Notes (Signed)
 [facility]    CSN: [id number] Arrival date & time: 06/06/24  1828      History   Chief Complaint Chief Complaint  Patient presents with   Rash    HPI Jenna Kaufman is a 43 y.o. female.  Patient presents with malodorous red rash underneath the skin fold in her lower abdomen groin area x 2 days.  She reports some drainage from the area.  No fever or chills.  She reports recurrent yeast skin infections.  Patient requests a steroid cream mometasone.  The history is provided by the patient and medical records.    Past Medical History:  Diagnosis Date   Anxiety    Depression    Diabetes mellitus without complication (HCC)    Fatty liver 07/14/2018   Fibroids    GERD (gastroesophageal reflux disease)    Hyperlipidemia    Hypertension    Schizophrenia (HCC)    Thyroid  disease     Patient Active Problem List   Diagnosis Date Noted   Hypothyroidism 05/05/2022   Mixed hyperlipidemia 05/05/2022   Vitamin B12 deficiency 05/05/2022   Fibroid 05/05/2022   Bipolar disorder (HCC) 09/11/2021   Essential hypertension 09/11/2021   Dyspareunia, female 09/11/2021   Fatty liver 07/14/2018    Past Surgical History:  Procedure Laterality Date   ABDOMINAL SURGERY     LAPAROSCOPIC GASTRIC BANDING     LAPAROSCOPIC GASTRIC SLEEVE RESECTION      OB History     Gravida  1   Para  1   Term  1   Preterm      AB      Living  1      SAB      IAB      Ectopic      Multiple      Live Births  1            Home Medications    Prior to Admission medications   Medication Sig Start Date End Date Taking? Authorizing Provider  clotrimazole -betamethasone  (LOTRISONE ) cream Apply to affected area 2 times daily. 06/06/24  Yes Corlis Burnard DEL, NP  Cholecalciferol (VITAMIN D-3) 125 MCG (5000 UT) TABS Take 5,000 Units by mouth daily.    [provider]  Continuous Blood Gluc Sensor (FREESTYLE LIBRE 3 SENSOR) MISC PLACE ONE SENSOR TO THE BACK OF YOUR UPPER  ARM. REPLACE EVERY 14 DAYS. 10/09/22   [provider]  cyanocobalamin (VITAMIN B12) 1000 MCG tablet Take 1,000 mcg by mouth daily.    [provider]  fenofibrate  160 MG tablet Take 1 tablet (160 mg total) by mouth daily. 05/13/22   Mardy Elveria DEL, NP  hydrOXYzine  (VISTARIL ) 25 MG capsule Take 50 mg by mouth 2 (two) times daily. 05/25/22   [provider]  ibuprofen (ADVIL) 800 MG tablet Take 800 mg by mouth every 8 (eight) hours as needed for mild pain.    [provider]  lamoTRIgine (LAMICTAL) 25 MG tablet Take 25 mg by mouth daily. 05/25/22   [provider]  LANTUS SOLOSTAR 100 UNIT/ML Solostar Pen Inject 20 Units into the skin 2 (two) times daily. 04/15/22   [provider]  lithium  carbonate 150 MG capsule Take 150 mg by mouth daily. 05/25/22   [provider]  metFORMIN  (GLUCOPHAGE ) 1000 MG tablet Take 1,000 mg by mouth 2 (two) times daily with a meal.    [provider]  Multiple Vitamin (MULTIVITAMIN WITH MINERALS) TABS tablet Take 1 tablet by  mouth daily.    [provider]  nystatin  cream (MYCOSTATIN ) Apply to affected area 2 times daily 10/13/22   Corlis Burnard DEL, NP  paliperidone (INVEGA) 6 MG 24 hr tablet Take 6 mg by mouth at bedtime. 05/26/22   [provider]  SODIUM FLUORIDE 5000 PPM 1.1 % PSTE Take 1 Application by mouth daily. 05/06/22   [provider]  traZODone  (DESYREL ) 50 MG tablet Take 1 tablet (50 mg total) by mouth at bedtime as needed for sleep. 05/13/22   Mardy Elveria DEL, NP  valACYclovir  (VALTREX ) 1000 MG tablet Take 1,000 mg by mouth daily.    [provider]    Family History Family History  Problem Relation Age of Onset   Stroke Paternal Grandfather    Diabetes Paternal Grandfather     Social History Social History   Tobacco Use   Smoking status: Never   Smokeless tobacco: Never  Vaping Use   Vaping status: Never Used  Substance Use Topics   Alcohol  use: Yes    Comment: 1-2 x month - wine, liquor   Drug use: Yes    Types: Marijuana     Allergies   Penicillins   Review of Systems Review of Systems  Constitutional:  Negative for chills and fever.  Skin:  Positive for color change and rash.     Physical Exam Triage Vital Signs ED Triage Vitals [06/06/24 1904]  Encounter Vitals Group     BP      Girls Systolic BP Percentile      Girls Diastolic BP Percentile      Boys Systolic BP Percentile      Boys Diastolic BP Percentile      Pulse      Resp      Temp      Temp src      SpO2      Weight      Height      Head Circumference      Peak Flow      Pain Score 4     Pain Loc      Pain Education      Exclude from Growth Chart    No data found.  Updated Vital Signs BP 112/72   Pulse 77   Temp (!) 97.5 F (36.4 C)   Resp 16   SpO2 97%   Visual Acuity Right Eye Distance:   Left Eye Distance:   Bilateral Distance:    Right Eye Near:   Left Eye Near:    Bilateral Near:     Physical Exam Constitutional:      General: She is not in acute distress.    Appearance: She is obese.  HENT:     Mouth/Throat:     Mouth: Mucous membranes are moist.  Cardiovascular:     Rate and Rhythm: Normal rate and regular rhythm.  Pulmonary:     Effort: Pulmonary effort is normal. No respiratory distress.  Skin:    General: Skin is warm and dry.     Findings: Rash present.     Comments: Brightly erythematous rash in skin fold between lower abdomen and groin.  No open wounds or drainage.  Neurological:     Mental Status: She is alert.      UC Treatments / Results  Labs (all labs ordered are listed, but only abnormal results are displayed) Labs Reviewed - No data to display  EKG   Radiology No results found.  Procedures  Procedures (including critical care time)  Medications Ordered in UC Medications - No data to display  Initial Impression / Assessment and Plan / UC Course  I have reviewed the triage  vital signs and the nursing notes.  Pertinent labs & imaging results that were available during my care of the patient were reviewed by me and considered in my medical decision making (see chart for details).    Candidal dermatitis.  Afebrile and vital signs are stable.  Treating today with Lotrisone  cream x 14 to 21 days.  Education provided on skin yeast infection.  Instructed her to follow-up with her PCP if her symptoms are not improving.  She agrees to plan of care.  Final Clinical Impressions(s) / UC Diagnoses   Final diagnoses:  Candidal dermatitis     Discharge Instructions      Use the Lotrisone  cream as directed.  Follow-up with your primary care provider if your symptoms are not improving.      ED Prescriptions     Medication Sig Dispense Auth. Provider   clotrimazole -betamethasone  (LOTRISONE ) cream Apply to affected area 2 times daily. 45 g Corlis Burnard DEL, NP      PDMP not reviewed this encounter.   Corlis Burnard DEL, NP 06/06/24 [phone]

## 2024-06-06 NOTE — ED Triage Notes (Addendum)
 Patient to Urgent Care with complaints of rash under her stomach. Reports the area smells/ has purulent drainage/ is sore. Symptoms x2-3 days.  Symptoms x2 days.   Momestone ointment 0.019% has been effective previously.

## 2024-06-06 NOTE — Discharge Instructions (Addendum)
 Use the Lotrisone  cream as directed.  Follow-up with your primary care provider if your symptoms are not improving.

## 2024-06-09 DIAGNOSIS — F250 Schizoaffective disorder, bipolar type: Secondary | ICD-10-CM | POA: Diagnosis not present

## 2024-06-09 DIAGNOSIS — F411 Generalized anxiety disorder: Secondary | ICD-10-CM | POA: Diagnosis not present

## 2024-06-09 DIAGNOSIS — F331 Major depressive disorder, recurrent, moderate: Secondary | ICD-10-CM | POA: Diagnosis not present

## 2024-06-19 DIAGNOSIS — G4700 Insomnia, unspecified: Secondary | ICD-10-CM | POA: Diagnosis not present

## 2024-06-19 DIAGNOSIS — E119 Type 2 diabetes mellitus without complications: Secondary | ICD-10-CM | POA: Diagnosis not present

## 2024-06-19 DIAGNOSIS — F250 Schizoaffective disorder, bipolar type: Secondary | ICD-10-CM | POA: Diagnosis not present

## 2024-06-19 DIAGNOSIS — Z7689 Persons encountering health services in other specified circumstances: Secondary | ICD-10-CM | POA: Diagnosis not present

## 2024-06-19 DIAGNOSIS — Z7984 Long term (current) use of oral hypoglycemic drugs: Secondary | ICD-10-CM | POA: Diagnosis not present

## 2024-06-19 DIAGNOSIS — E039 Hypothyroidism, unspecified: Secondary | ICD-10-CM | POA: Diagnosis not present

## 2024-06-19 DIAGNOSIS — Z7951 Long term (current) use of inhaled steroids: Secondary | ICD-10-CM | POA: Diagnosis not present

## 2024-06-19 DIAGNOSIS — F411 Generalized anxiety disorder: Secondary | ICD-10-CM | POA: Diagnosis not present

## 2024-06-19 DIAGNOSIS — Z794 Long term (current) use of insulin: Secondary | ICD-10-CM | POA: Diagnosis not present

## 2024-06-19 DIAGNOSIS — Z20822 Contact with and (suspected) exposure to covid-19: Secondary | ICD-10-CM | POA: Diagnosis not present

## 2024-06-20 DIAGNOSIS — Z7689 Persons encountering health services in other specified circumstances: Secondary | ICD-10-CM | POA: Diagnosis not present

## 2024-06-20 DIAGNOSIS — I10 Essential (primary) hypertension: Secondary | ICD-10-CM | POA: Diagnosis not present

## 2024-06-22 DIAGNOSIS — F250 Schizoaffective disorder, bipolar type: Secondary | ICD-10-CM | POA: Diagnosis not present

## 2024-06-23 DIAGNOSIS — F250 Schizoaffective disorder, bipolar type: Secondary | ICD-10-CM | POA: Diagnosis not present

## 2024-06-24 DIAGNOSIS — E1165 Type 2 diabetes mellitus with hyperglycemia: Secondary | ICD-10-CM | POA: Diagnosis not present

## 2024-06-24 DIAGNOSIS — Z8639 Personal history of other endocrine, nutritional and metabolic disease: Secondary | ICD-10-CM | POA: Diagnosis not present

## 2024-06-24 DIAGNOSIS — F250 Schizoaffective disorder, bipolar type: Secondary | ICD-10-CM | POA: Diagnosis not present

## 2024-06-25 DIAGNOSIS — F250 Schizoaffective disorder, bipolar type: Secondary | ICD-10-CM | POA: Diagnosis not present

## 2024-06-26 DIAGNOSIS — F250 Schizoaffective disorder, bipolar type: Secondary | ICD-10-CM | POA: Diagnosis not present

## 2024-06-26 DIAGNOSIS — E1165 Type 2 diabetes mellitus with hyperglycemia: Secondary | ICD-10-CM | POA: Diagnosis not present

## 2024-06-27 DIAGNOSIS — E1165 Type 2 diabetes mellitus with hyperglycemia: Secondary | ICD-10-CM | POA: Diagnosis not present

## 2024-06-27 DIAGNOSIS — F250 Schizoaffective disorder, bipolar type: Secondary | ICD-10-CM | POA: Diagnosis not present

## 2024-06-28 DIAGNOSIS — F250 Schizoaffective disorder, bipolar type: Secondary | ICD-10-CM | POA: Diagnosis not present

## 2024-06-29 DIAGNOSIS — F250 Schizoaffective disorder, bipolar type: Secondary | ICD-10-CM | POA: Diagnosis not present

## 2024-06-30 DIAGNOSIS — F250 Schizoaffective disorder, bipolar type: Secondary | ICD-10-CM | POA: Diagnosis not present

## 2024-06-30 DIAGNOSIS — E119 Type 2 diabetes mellitus without complications: Secondary | ICD-10-CM | POA: Diagnosis not present

## 2024-07-01 DIAGNOSIS — F250 Schizoaffective disorder, bipolar type: Secondary | ICD-10-CM | POA: Diagnosis not present

## 2024-07-02 DIAGNOSIS — F250 Schizoaffective disorder, bipolar type: Secondary | ICD-10-CM | POA: Diagnosis not present

## 2024-07-03 DIAGNOSIS — F250 Schizoaffective disorder, bipolar type: Secondary | ICD-10-CM | POA: Diagnosis not present

## 2024-07-04 DIAGNOSIS — F250 Schizoaffective disorder, bipolar type: Secondary | ICD-10-CM | POA: Diagnosis not present

## 2024-07-05 DIAGNOSIS — F250 Schizoaffective disorder, bipolar type: Secondary | ICD-10-CM | POA: Diagnosis not present

## 2024-07-06 DIAGNOSIS — F250 Schizoaffective disorder, bipolar type: Secondary | ICD-10-CM | POA: Diagnosis not present

## 2024-07-07 DIAGNOSIS — F250 Schizoaffective disorder, bipolar type: Secondary | ICD-10-CM | POA: Diagnosis not present

## 2024-07-10 DIAGNOSIS — F250 Schizoaffective disorder, bipolar type: Secondary | ICD-10-CM | POA: Diagnosis not present

## 2024-07-10 DIAGNOSIS — F411 Generalized anxiety disorder: Secondary | ICD-10-CM | POA: Diagnosis not present

## 2024-07-10 DIAGNOSIS — F5101 Primary insomnia: Secondary | ICD-10-CM | POA: Diagnosis not present

## 2024-07-13 DIAGNOSIS — J069 Acute upper respiratory infection, unspecified: Secondary | ICD-10-CM | POA: Diagnosis not present

## 2024-07-13 DIAGNOSIS — R0981 Nasal congestion: Secondary | ICD-10-CM | POA: Diagnosis not present

## 2024-08-09 DIAGNOSIS — Z6839 Body mass index (BMI) 39.0-39.9, adult: Secondary | ICD-10-CM | POA: Diagnosis not present

## 2024-08-09 DIAGNOSIS — E1142 Type 2 diabetes mellitus with diabetic polyneuropathy: Secondary | ICD-10-CM | POA: Diagnosis not present

## 2024-08-09 DIAGNOSIS — R809 Proteinuria, unspecified: Secondary | ICD-10-CM | POA: Diagnosis not present

## 2024-08-09 DIAGNOSIS — I10 Essential (primary) hypertension: Secondary | ICD-10-CM | POA: Diagnosis not present

## 2024-08-09 DIAGNOSIS — E669 Obesity, unspecified: Secondary | ICD-10-CM | POA: Diagnosis not present

## 2024-08-09 DIAGNOSIS — E039 Hypothyroidism, unspecified: Secondary | ICD-10-CM | POA: Diagnosis not present

## 2024-08-09 DIAGNOSIS — F319 Bipolar disorder, unspecified: Secondary | ICD-10-CM | POA: Diagnosis not present

## 2024-08-09 DIAGNOSIS — F209 Schizophrenia, unspecified: Secondary | ICD-10-CM | POA: Diagnosis not present

## 2024-08-09 DIAGNOSIS — E785 Hyperlipidemia, unspecified: Secondary | ICD-10-CM | POA: Diagnosis not present

## 2024-08-10 DIAGNOSIS — E1169 Type 2 diabetes mellitus with other specified complication: Secondary | ICD-10-CM | POA: Diagnosis not present

## 2024-08-10 DIAGNOSIS — E1129 Type 2 diabetes mellitus with other diabetic kidney complication: Secondary | ICD-10-CM | POA: Diagnosis not present

## 2024-08-10 DIAGNOSIS — Z794 Long term (current) use of insulin: Secondary | ICD-10-CM | POA: Diagnosis not present

## 2024-08-10 DIAGNOSIS — E785 Hyperlipidemia, unspecified: Secondary | ICD-10-CM | POA: Diagnosis not present

## 2024-08-10 DIAGNOSIS — E66812 Obesity, class 2: Secondary | ICD-10-CM | POA: Diagnosis not present

## 2024-08-10 DIAGNOSIS — E1165 Type 2 diabetes mellitus with hyperglycemia: Secondary | ICD-10-CM | POA: Diagnosis not present

## 2024-08-10 DIAGNOSIS — Z6838 Body mass index (BMI) 38.0-38.9, adult: Secondary | ICD-10-CM | POA: Diagnosis not present

## 2024-08-10 DIAGNOSIS — R809 Proteinuria, unspecified: Secondary | ICD-10-CM | POA: Diagnosis not present

## 2024-08-21 DIAGNOSIS — Z1331 Encounter for screening for depression: Secondary | ICD-10-CM | POA: Diagnosis not present

## 2024-08-21 DIAGNOSIS — F411 Generalized anxiety disorder: Secondary | ICD-10-CM | POA: Diagnosis not present

## 2024-08-21 DIAGNOSIS — Z1330 Encounter for screening examination for mental health and behavioral disorders, unspecified: Secondary | ICD-10-CM | POA: Diagnosis not present

## 2024-08-21 DIAGNOSIS — F250 Schizoaffective disorder, bipolar type: Secondary | ICD-10-CM | POA: Diagnosis not present

## 2024-08-21 DIAGNOSIS — F5101 Primary insomnia: Secondary | ICD-10-CM | POA: Diagnosis not present

## 2024-10-09 ENCOUNTER — Other Ambulatory Visit
Admission: RE | Admit: 2024-10-09 | Discharge: 2024-10-09 | Disposition: A | Discharge status: Home / Self Care | Source: Ambulatory Visit | Attending: Infectious Diseases | Admitting: Infectious Diseases

## 2024-10-09 DIAGNOSIS — Z1211 Encounter for screening for malignant neoplasm of colon: Secondary | ICD-10-CM | POA: Insufficient documentation

## 2024-10-09 DIAGNOSIS — E66813 Obesity, class 3: Secondary | ICD-10-CM | POA: Diagnosis present

## 2024-10-09 DIAGNOSIS — Z0000 Encounter for general adult medical examination without abnormal findings: Secondary | ICD-10-CM | POA: Insufficient documentation

## 2024-10-09 DIAGNOSIS — G4733 Obstructive sleep apnea (adult) (pediatric): Secondary | ICD-10-CM | POA: Diagnosis present

## 2024-10-09 DIAGNOSIS — Z6841 Body Mass Index (BMI) 40.0 and over, adult: Secondary | ICD-10-CM | POA: Diagnosis present

## 2024-10-09 DIAGNOSIS — E039 Hypothyroidism, unspecified: Secondary | ICD-10-CM | POA: Diagnosis present

## 2024-10-09 DIAGNOSIS — F250 Schizoaffective disorder, bipolar type: Secondary | ICD-10-CM | POA: Insufficient documentation

## 2024-10-09 DIAGNOSIS — R809 Proteinuria, unspecified: Secondary | ICD-10-CM | POA: Insufficient documentation

## 2024-10-09 DIAGNOSIS — Z1212 Encounter for screening for malignant neoplasm of rectum: Secondary | ICD-10-CM | POA: Diagnosis present

## 2024-10-09 DIAGNOSIS — E782 Mixed hyperlipidemia: Secondary | ICD-10-CM | POA: Insufficient documentation

## 2024-10-09 DIAGNOSIS — E1129 Type 2 diabetes mellitus with other diabetic kidney complication: Secondary | ICD-10-CM | POA: Diagnosis present

## 2024-10-09 DIAGNOSIS — Z1331 Encounter for screening for depression: Secondary | ICD-10-CM | POA: Diagnosis present

## 2024-10-09 DIAGNOSIS — F5101 Primary insomnia: Secondary | ICD-10-CM | POA: Insufficient documentation

## 2024-10-09 DIAGNOSIS — Z794 Long term (current) use of insulin: Secondary | ICD-10-CM | POA: Insufficient documentation

## 2024-10-09 DIAGNOSIS — Z1231 Encounter for screening mammogram for malignant neoplasm of breast: Secondary | ICD-10-CM | POA: Diagnosis present

## 2024-10-09 LAB — LITHIUM LEVEL: Lithium Lvl: 0.53 mmol/L — ABNORMAL LOW (ref 0.60–1.20)
